# Patient Record
Sex: Male | Born: 1960 | Race: Black or African American | Hispanic: No | Smoking: Former smoker
Health system: Southern US, Community
[De-identification: ages and names within clinical notes are randomized; demographics above are authoritative.]

## PROBLEM LIST (undated history)

## (undated) DIAGNOSIS — E119 Type 2 diabetes mellitus without complications: Secondary | ICD-10-CM

## (undated) DIAGNOSIS — I4891 Unspecified atrial fibrillation: Secondary | ICD-10-CM

## (undated) DIAGNOSIS — K219 Gastro-esophageal reflux disease without esophagitis: Secondary | ICD-10-CM

## (undated) DIAGNOSIS — E785 Hyperlipidemia, unspecified: Secondary | ICD-10-CM

## (undated) DIAGNOSIS — I10 Essential (primary) hypertension: Secondary | ICD-10-CM

## (undated) DIAGNOSIS — I517 Cardiomegaly: Secondary | ICD-10-CM

## (undated) HISTORY — PX: NO PAST SURGERIES: SHX2092

---

## 2004-03-31 ENCOUNTER — Other Ambulatory Visit: Payer: Self-pay

## 2010-07-13 ENCOUNTER — Inpatient Hospital Stay: Payer: Self-pay | Admitting: Internal Medicine

## 2010-12-16 ENCOUNTER — Emergency Department: Payer: Self-pay | Admitting: Emergency Medicine

## 2011-06-07 ENCOUNTER — Observation Stay: Payer: Self-pay | Admitting: Internal Medicine

## 2011-06-07 DIAGNOSIS — R002 Palpitations: Secondary | ICD-10-CM

## 2011-06-07 DIAGNOSIS — R079 Chest pain, unspecified: Secondary | ICD-10-CM

## 2011-06-07 DIAGNOSIS — I059 Rheumatic mitral valve disease, unspecified: Secondary | ICD-10-CM

## 2012-04-19 ENCOUNTER — Observation Stay: Payer: Self-pay | Admitting: Family Medicine

## 2012-04-19 ENCOUNTER — Ambulatory Visit: Payer: Self-pay | Admitting: Internal Medicine

## 2012-04-19 LAB — CBC
HCT: 46.7 % (ref 40.0–52.0)
MCHC: 33.2 g/dL (ref 32.0–36.0)
Platelet: 174 10*3/uL (ref 150–440)
RDW: 12.7 % (ref 11.5–14.5)
WBC: 4.9 10*3/uL (ref 3.8–10.6)

## 2012-04-19 LAB — CK TOTAL AND CKMB (NOT AT ARMC)
CK, Total: 180 U/L (ref 35–232)
CK-MB: 3.9 ng/mL — ABNORMAL HIGH (ref 0.5–3.6)

## 2012-04-19 LAB — TROPONIN I
Troponin-I: <0.02 ng/mL
Troponin-I: <0.02 ng/mL

## 2012-04-19 LAB — BASIC METABOLIC PANEL
Calcium, Total: 9.2 mg/dL (ref 8.5–10.1)
Co2: 25 mmol/L (ref 21–32)
Creatinine: 1.27 mg/dL (ref 0.60–1.30)
EGFR (African American): >60
Potassium: 3.7 mmol/L (ref 3.5–5.1)
Sodium: 142 mmol/L (ref 136–145)

## 2012-04-19 LAB — HEPATIC FUNCTION PANEL A (ARMC)
Bilirubin,Total: 0.4 mg/dL (ref 0.2–1.0)
SGOT(AST): 26 U/L (ref 15–37)

## 2012-04-20 LAB — LIPID PANEL
Cholesterol: 148 mg/dL (ref 0–200)
HDL Cholesterol: 26 mg/dL — ABNORMAL LOW (ref 40–60)

## 2012-04-20 LAB — CK TOTAL AND CKMB (NOT AT ARMC)
CK, Total: 147 U/L (ref 35–232)
CK-MB: 3.4 ng/mL (ref 0.5–3.6)

## 2012-07-06 ENCOUNTER — Emergency Department: Payer: Self-pay | Admitting: Emergency Medicine

## 2012-07-06 LAB — CBC
MCH: 28.6 pg (ref 26.0–34.0)
Platelet: 171 10*3/uL (ref 150–440)
RDW: 12.9 % (ref 11.5–14.5)
WBC: 3.9 10*3/uL (ref 3.8–10.6)

## 2012-07-07 LAB — COMPREHENSIVE METABOLIC PANEL
Albumin: 4 g/dL (ref 3.4–5.0)
Alkaline Phosphatase: 137 U/L — ABNORMAL HIGH (ref 50–136)
Anion Gap: 9 (ref 7–16)
BUN: 18 mg/dL (ref 7–18)
Bilirubin,Total: 0.5 mg/dL (ref 0.2–1.0)
Calcium, Total: 8.9 mg/dL (ref 8.5–10.1)
Chloride: 104 mmol/L (ref 98–107)
Co2: 24 mmol/L (ref 21–32)
Creatinine: 1.29 mg/dL (ref 0.60–1.30)
EGFR (African American): >60
EGFR (Non-African Amer.): >60
Glucose: 345 mg/dL — ABNORMAL HIGH (ref 65–99)
Potassium: 4.2 mmol/L (ref 3.5–5.1)
SGOT(AST): 26 U/L (ref 15–37)
SGPT (ALT): 40 U/L (ref 12–78)
Total Protein: 7.5 g/dL (ref 6.4–8.2)

## 2012-07-07 LAB — URINALYSIS, COMPLETE
Bilirubin,UR: NEGATIVE
Glucose,UR: >=500 mg/dL (ref 0–75)
Ketone: NEGATIVE
Protein: NEGATIVE
RBC,UR: 1 /HPF (ref 0–5)
Specific Gravity: 1.025 (ref 1.003–1.030)
WBC UR: 4 /HPF (ref 0–5)

## 2012-11-29 ENCOUNTER — Emergency Department: Payer: Self-pay | Admitting: Emergency Medicine

## 2012-11-29 LAB — COMPREHENSIVE METABOLIC PANEL
Albumin: 3.6 g/dL (ref 3.4–5.0)
Alkaline Phosphatase: 113 U/L (ref 50–136)
Anion Gap: 7 (ref 7–16)
Calcium, Total: 8.6 mg/dL (ref 8.5–10.1)
Co2: 28 mmol/L (ref 21–32)
Creatinine: 1.16 mg/dL (ref 0.60–1.30)
Glucose: 179 mg/dL — ABNORMAL HIGH (ref 65–99)
Osmolality: 289 (ref 275–301)
Potassium: 3.6 mmol/L (ref 3.5–5.1)
SGOT(AST): 19 U/L (ref 15–37)
SGPT (ALT): 29 U/L (ref 12–78)
Total Protein: 7.1 g/dL (ref 6.4–8.2)

## 2012-11-29 LAB — CBC
HCT: 48.8 % (ref 40.0–52.0)
HGB: 16.2 g/dL (ref 13.0–18.0)
MCH: 28 pg (ref 26.0–34.0)
Platelet: 176 10*3/uL (ref 150–440)
RBC: 5.81 10*6/uL (ref 4.40–5.90)
RDW: 13.1 % (ref 11.5–14.5)
WBC: 4.5 10*3/uL (ref 3.8–10.6)

## 2012-11-29 LAB — TROPONIN I: Troponin-I: <0.02 ng/mL

## 2012-11-29 LAB — TSH: Thyroid Stimulating Horm: 1.09 u[IU]/mL

## 2015-01-01 NOTE — Consult Note (Signed)
PATIENT NAME:  Earl Griffin, Earl Griffin MR#:  [id number] DATE OF BIRTH:  12-20-1960  DATE OF CONSULTATION:  04/20/2012  REFERRING PHYSICIAN:  Larena GlassmanAmir Firozvi, MD CONSULTING PHYSICIAN:  Lamar BlinksBruce J. Maizee Reinhold, MD  PRIMARY CARE PHYSICIAN: Evelene CroonMeindert Niemeyer, MD  REASON FOR CONSULTATION: Atrial fibrillation with unstable angina.   CHIEF COMPLAINT: "I have chest pain."   HISTORY OF PRESENT ILLNESS: This is a 54 year old male known chronic atrial fibrillation with controlled rate on Cardizem and aspirin with a CHADS score of 0 who has had new of substernal chest discomfort and pressure radiating into his back associated with shortness of breath lasting for approximately 3 to 4 hours and somewhat relieved with nitroglycerin and oxygen. The patient has a troponin and CK-MB with no evidence of myocardial infarction. The patient's EKG shows atrial fibrillation with controlled ventricular rate and nonspecific ST changes. The patient has had no other symptoms since admission.   REVIEW OF SYSTEMS: The remainder review of systems is negative for vision change, ringing in the ears, hearing loss, cough, congestion, heartburn, nausea, vomiting, diarrhea, bloody stools, stomach pain, extremity pain, leg weakness, cramping of the buttocks, known blood clots, headaches, blackouts, dizzy spells, nosebleeds, congestion, trouble swallowing, frequent urination, urination at night, muscle weakness, numbness, anxiety, depression, skin lesions, or skin rashes.   PAST MEDICAL HISTORY: Atrial fibrillation.   FAMILY HISTORY: Father had early onset of myocardial infarction.   SOCIAL HISTORY: Currently denies alcohol or tobacco use.   ALLERGIES: No known drug allergies.   CURRENT MEDICATIONS: As listed.    PHYSICAL EXAMINATION:   VITAL SIGNS: Blood pressure 126/68 bilaterally and heart rate is 72 upright and reclining, and irregular.   GENERAL: He is a well appearing male in no acute distress.   HEENT: No icterus, thyromegaly,  ulcers, hemorrhage, or xanthelasma.   HEART: Irregularly irregular with normal S1 and S2 without murmur, gallop, or rub. Point of maximal impulse is diffuse. Carotid upstroke is normal without bruit. Jugular venous pressure is normal.   LUNGS: Lungs are clear to auscultation with normal respirations.   ABDOMEN: Soft and nontender without hepatosplenomegaly or masses. Abdominal aorta is normal size without bruit.   EXTREMITIES: 2+ radial, femoral, and dorsal pedal pulses with no lower extremity edema, cyanosis, clubbing, or ulcers.   NEUROLOGIC: He is oriented to time, place, and person with normal mood and affect.   ASSESSMENT: This is a 54 year old male with chronic atrial fibrillation and borderline hypertension with abnormal EKG, chest pain, and pressure needing further treatment options.   RECOMMENDATIONS:  1. Treadmill EKG to assess for myocardial ischemia and rhythm disturbances and control of rhythm with further treatment thereof as necessary. 2. Aspirin for anticoagulation for further risk reduction and stroke.          3. Continue Cardizem for heart rate control, but would consider further possible intervention including the possibility of antiarrhythmics with electrical cardioversion and other issues including ablation for which we have discussed at length at this time. He will potentially pursue those depending on stress test and/or echocardiographic results.  ____________________________ Lamar BlinksBruce J. Weldon Nouri, MD bjk:slb D: 04/20/2012 14:19:09 ET T: 04/20/2012 15:54:22 ET JOB#: [id number]  cc: Lamar BlinksBruce J. Jaydeen Odor, MD, <Dictator> Lamar BlinksBRUCE J Omari Koslosky MD ELECTRONICALLY SIGNED 04/20/2012 17:16

## 2015-01-01 NOTE — H&P (Signed)
PATIENT NAME:  Earl Griffin, Earl Griffin MR#:  [id number] DATE OF BIRTH:  Mar 14, 1961  DATE OF ADMISSION:  04/19/2012  REFERRING PHYSICIAN: Si Raider, MD  PRIMARY CARE PHYSICIAN: Evelene Croon, MD  CARDIOLOGIST: Adrian Blackwater, MD  REASON FOR ADMISSION: Chest pain.   HISTORY OF PRESENT ILLNESS: This is a pleasant 54 year old African American male with history of hypertension, atrial fibrillation that is rate controlled, and diet-controlled diabetes who at around 10:00 a.m. had some chest tightness lasting 10 to 20 minutes. He states it happened after drinking iced tea quickly. He said it felt like he was having to burp or had gas in his throat. He said he also got anxious and felt a little bit lightheaded. No shortness of breath, nausea, vomiting, or diarrhea. He went to [facility] and was given aspirin 325 mg and was referred here. In the Emergency Room, Dr. Brien Mates did an EKG which showed atrial fibrillation and labs which showed no troponin leak and chest x-ray was ordered. The patient currently is chest pain free.   PAST MEDICAL HISTORY:  1. Atrial fibrillation. 2. Obesity. 3. Hypertension. 4. Type 2 diabetes.   PAST SURGICAL HISTORY: None.   MEDICATIONS:  1. Aspirin 325 mg daily.  2. Diltiazem 120 mg daily.   DRUG ALLERGIES: No known drug allergies.   SOCIAL HISTORY: He is divorced with one child who is healthy. He works as a Merchandiser, retail in [employer]. He used to smoke 2 to 3 cigarettes a day, quit six years ago.   FAMILY HISTORY: Father died at age 86 of CVA. He had a heart attack at age 76. Mother died at age 26 of breast cancer.   REVIEW OF SYSTEMS: CONSTITUTIONAL: No fever, fatigue, or weakness. EYES: No blurred vision, double vision, pain, redness, inflammation, or glaucoma. ENT: No tinnitus, ear pain, hearing loss, seasonal allergies, epistaxis, or discharge. RESPIRATORY: No cough, wheezing, hemoptysis, dizziness, asthma, or painful respiration. CARDIOVASCULAR: He has chest  pain which is resolved. No orthopnea, edema, arrhythmias, or dyspnea on exertion. GI: No nausea, vomiting, diarrhea, abdominal pain, hematemesis, melena, or gastroesophageal reflux disease. GENITOURINARY: No dysuria, hematuria, renal calculi, or increased frequency. GU/MALE: No sores, discharge, prostatitis, or erectile dysfunction. ENDOCRINE: No polyuria, nocturia, thyroid problems, increased sweating, or heat or cold intolerance. HEME/LYMPH: No anemia, easy bruising, or swollen glands. INTEGUMENT: No acne, rash, or change in mole, hair, or skin. MUSCULOSKELETAL: No pain in back, shoulders, knees, or hips. No arthritis, swelling, or gout. NEUROLOGIC: No numbness, weakness, dysarthria, epilepsy, tremor, vertigo, or ataxia. PSYCH: No anxiety, insomnia, ADD, bipolar, or depression.   PHYSICAL EXAMINATION:   VITAL SIGNS: Temperature 96.6, heart rate 89, respirations 18, blood pressure 145/100, and saturating 97% on room air.   GENERAL: The patient well developed, well nourished, high BMI.   HEENT: Pupils are equal and reactive to light and accommodation. Extraocular movements are intact. Anicteric sclerae. No difficulty hearing. Oropharynx is clear.   NECK: No JVD. No thyromegaly. No lymphadenopathy. No carotid bruits.   LUNGS: Clear to auscultation. No adventitious breath sounds. Resonant to percussion.   CARDIOVASCULAR: Regular rate and rhythm. Normal S1 and S2. No murmurs, gallops, or rubs appreciated. No lower extremity edema. PMI is not lateralized. No thrills on palpation.   BREASTS: No obvious masses.   ABDOMEN: Soft, nontender, and nondistended. Positive bowel sounds. No organomegaly.   GU: Deferred.   MUSCULOSKELETAL: Strength 5/5. No clubbing, cyanosis, or degenerative joint disease.   SKIN: No rashes, lesions, or induration.   LYMPH: No lymphadenopathy  in the cervical or supraclavicular area.  NEUROLOGIC: Cranial nerves II through XII are intact. Sensation intact. Strength 5 out  of 5. No aphasia, dysphagia, or contractures.   PSYCH: Alert and oriented x3.   LABORATORY, DIAGNOSTIC AND RADIOLOGIC DATA: EKG shows atrial fibrillation, rate of 84.   Chest x-ray shows no acute cardiopulmonary process on my examination.  Glucose 213, BUN 16, creatinine 1.27, sodium 142, potassium 3.7, chloride 109, and bicarbonate 25.  CK 207, MB 4.3, and troponin less than 0.2. White count 4.9, hemoglobin 15.5, hematocrit 46.7, and platelets 174.   ASSESSMENT AND PLAN: This is a pleasant 54 year old African American male with history of hypertension, diabetes diet controlled, and atrial fibrillation who presents with chest pain. 1. Chest pain, most consistent with GERD. We will give Protonix. The patient has not had recent stress. We will go ahead and order this. I was trying to get this done today, but we are unable to do this. Dr. Welton FlakesKhan [place] so we will order this with Dr. Gwen PoundsKowalski. We have given p.r.n. morphine, nitroglycerin, beta blocker, and aspirin. In addition, we will give the patient Lovenox 1 mg/kg twice a day.  2. Hypertension. We have added beta blocker.  3. Rate-controlled atrial fibrillation. We will give diltiazem.  4. Obesity. Counseled about diet and exercise. Check a lipid panel. We will hold off on starting statin as it is not believed this is really unstable angina.  5. Diabetes. We will continue ADA diet and check A1c and start medications, i.e. metformin, if need be.  6. Deep vein thrombosis prophylaxis. Maintain with full dose Lovenox and aspirin.  CODE STATUS: FULL CODE.  Thank you for allowing me to participate in the care of this patient.   TOTAL TIME SPENT: 50 minutes. ____________________________ Corie ChiquitoAmir A. Lafayette DragonFirozvi, MD aaf:slb D: 04/19/2012 13:48:15 ET T: 04/19/2012 14:51:49 ET JOB#: [id number]  cc: Karolee OhsAmir A. [place] DragonFirozvi, MD, <Dictator> Meindert A. Lacie ScottsNiemeyer, MD Laurier NancyShaukat A. Khan, MD Karolee OhsAMIR Laverda PageA Daimian Sudberry MD ELECTRONICALLY SIGNED 04/19/2012 16:09

## 2015-01-01 NOTE — Discharge Summary (Signed)
PATIENT NAME:  Earl Griffin, Earl Griffin MR#:  [id number] DATE OF BIRTH:  05/25/1961  DATE OF ADMISSION:  04/19/2012 DATE OF DISCHARGE:  04/20/2012  REASON FOR ADMISSION: Chest pain.   OTHER DIAGNOSES:  1. Gastroesophageal reflux disease, which is the final diagnosis given for the chest pain.  2. Atrial fibrillation.  3. Obesity.  4. Hypertension.  5. Type 2 diabetes.  6. Low HDL.   PROCEDURES: Cardiac stress test completed and reviewed by Dr. Arnoldo HookerBruce Kowalski with normal perfusion and reasonable heart control for what the patient is good to be discharged.  DISCHARGE MEDICATIONS: 1. Aspirin 325 mg daily. 2. Diltiazem 120 mg once a day. 3. Protonix 40 mg once a day.  4. Niacin 250 mg extended release.  NOTE: Protonix and niacin are new prescriptions given to the patient.   HOSPITAL COURSE: Mr. Earl Knackerry Drohan is a very nice 54 year old man male with history of atrial fibrillation, diabetes, hypertension, and obesity. He came to the ER with a history of pain around 10:00 a.m. in the morning, chest tightness lasting 10 to 20 minutes. The patient was drinking some iced tea very quickly and he felt like he was burping and having some gas stuck there. He had some burning sensation after a while that lasted for a little bit longer. [facility] and he was given 325 mg of aspirin. EKG showed atrial fibrillation and his labs showed a troponin leak for what the patient was sent to the ER at [facility].   He was brought up and put on morphine, nitroglycerin, and a beta blocker in conjunction with the aspirin, and a stress test was ordered for this morning. His atrial fibrillation was controlled all along and his stress test was negative, as mentioned in the test results above.   In discussion with the patient about control of his GERD, Protonix prescription has been given to the patient as well as a niacin prescription. The niacin prescription is given due to the fact that the  patient had a very low HDL of 26, his LDL is actually normal at 24, and his triglycerides are only 140.   The patient is warned about side effects of niacin so he does not get scared if he has any flushing or increased temperature on the level of his face. The patient is discharged in good condition. He needs to followup with his primary care physician, Dr. Evelene CroonMeindert Niemeyer, to figure out continuation of treatment of his low HDL. ____________________________ Felipa Furnaceoberto Sanchez Gutierrez, MD rsg:slb D: 04/20/2012 14:25:11 ET T: 04/21/2012 10:05:51 ET JOB#: [id number]  cc: Felipa Furnaceoberto Sanchez Gutierrez, MD, <Dictator> Meindert A. Lacie ScottsNiemeyer, MD Regan RakersOBERTO Juanda ChanceSANCHEZ GUTIERRE MD ELECTRONICALLY SIGNED 04/30/2012 13:19

## 2015-10-03 ENCOUNTER — Encounter: Payer: Self-pay | Admitting: *Deleted

## 2015-10-04 ENCOUNTER — Encounter: Payer: Self-pay | Admitting: *Deleted

## 2015-10-04 ENCOUNTER — Encounter: Admission: RE | Payer: Self-pay | Source: Ambulatory Visit

## 2015-10-04 ENCOUNTER — Ambulatory Visit
Admission: RE | Admit: 2015-10-04 | Payer: BLUE CROSS/BLUE SHIELD | Source: Ambulatory Visit | Admitting: Gastroenterology

## 2015-10-04 HISTORY — DX: Type 2 diabetes mellitus without complications: E11.9

## 2015-10-04 HISTORY — DX: Gastro-esophageal reflux disease without esophagitis: K21.9

## 2015-10-04 HISTORY — DX: Essential (primary) hypertension: I10

## 2015-10-04 HISTORY — DX: Cardiomegaly: I51.7

## 2015-10-04 HISTORY — DX: Unspecified atrial fibrillation: I48.91

## 2015-10-04 HISTORY — DX: Hyperlipidemia, unspecified: E78.5

## 2015-10-04 SURGERY — COLONOSCOPY WITH PROPOFOL
Anesthesia: General

## 2015-10-04 NOTE — Anesthesia Preprocedure Evaluation (Deleted)

## 2015-12-02 ENCOUNTER — Emergency Department
Admission: EM | Admit: 2015-12-02 | Discharge: 2015-12-02 | Disposition: A | Discharge status: Home / Self Care | Payer: 59 | Attending: Emergency Medicine | Admitting: Emergency Medicine

## 2015-12-02 ENCOUNTER — Encounter: Payer: Self-pay | Admitting: Emergency Medicine

## 2015-12-02 DIAGNOSIS — Z7982 Long term (current) use of aspirin: Secondary | ICD-10-CM | POA: Diagnosis not present

## 2015-12-02 DIAGNOSIS — R42 Dizziness and giddiness: Secondary | ICD-10-CM | POA: Diagnosis present

## 2015-12-02 DIAGNOSIS — E119 Type 2 diabetes mellitus without complications: Secondary | ICD-10-CM | POA: Insufficient documentation

## 2015-12-02 DIAGNOSIS — I480 Paroxysmal atrial fibrillation: Secondary | ICD-10-CM | POA: Diagnosis not present

## 2015-12-02 DIAGNOSIS — I10 Essential (primary) hypertension: Secondary | ICD-10-CM | POA: Diagnosis not present

## 2015-12-02 DIAGNOSIS — Z79899 Other long term (current) drug therapy: Secondary | ICD-10-CM | POA: Insufficient documentation

## 2015-12-02 LAB — BASIC METABOLIC PANEL
ANION GAP: 4 — AB (ref 5–15)
BUN: 16 mg/dL (ref 6–20)
CHLORIDE: 107 mmol/L (ref 101–111)
CO2: 25 mmol/L (ref 22–32)
Calcium: 9 mg/dL (ref 8.9–10.3)
Creatinine, Ser: 1.47 mg/dL — ABNORMAL HIGH (ref 0.61–1.24)
GFR calc Af Amer: >60 mL/min (ref >60)
GFR, EST NON AFRICAN AMERICAN: 52 mL/min — AB (ref >60)
GLUCOSE: 136 mg/dL — AB (ref 65–99)
POTASSIUM: 3.7 mmol/L (ref 3.5–5.1)
Sodium: 136 mmol/L (ref 135–145)

## 2015-12-02 LAB — TROPONIN I: Troponin I: <0.03 ng/mL (ref <0.031)

## 2015-12-02 LAB — CBC
HEMATOCRIT: 49.8 % (ref 40.0–52.0)
HEMOGLOBIN: 16.7 g/dL (ref 13.0–18.0)
MCH: 28.3 pg (ref 26.0–34.0)
MCHC: 33.5 g/dL (ref 32.0–36.0)
MCV: 84.3 fL (ref 80.0–100.0)
Platelets: 197 10*3/uL (ref 150–440)
RBC: 5.91 MIL/uL — ABNORMAL HIGH (ref 4.40–5.90)
RDW: 13.1 % (ref 11.5–14.5)
WBC: 5.5 10*3/uL (ref 3.8–10.6)

## 2015-12-02 MED ORDER — AMIODARONE HCL 400 MG PO TABS: 400.0000 mg | ORAL_TABLET | Freq: Two times a day (BID) | ORAL | Status: DC

## 2015-12-02 MED ORDER — APIXABAN 5 MG PO TABS
5.0000 mg | ORAL_TABLET | Freq: Two times a day (BID) | ORAL | Status: AC
Start: 2015-12-02 — End: ?

## 2015-12-02 MED ORDER — APIXABAN 5 MG PO TABS
5.0000 mg | ORAL_TABLET | Freq: Once | ORAL | Status: AC
  Administered 2015-12-02: 5 mg via ORAL
  Filled 2015-12-02: qty 1

## 2015-12-02 NOTE — ED Notes (Signed)
Pharmacy called and notified of need of eliquis. States they will send it up.

## 2015-12-02 NOTE — ED Notes (Signed)
Pt had a "dizzy spell" at work, drove home and got dizzy in the car so he called EMS.  EMS reports pt in a fib, vitals in normal range, skin w/d. 20g in left wrist.

## 2015-12-02 NOTE — ED Provider Notes (Signed)
[facility] Emergency Department Provider Note  Time seen: 4:55 PM  I have reviewed the triage vital signs and the nursing notes.   HISTORY  Chief Complaint Dizziness    HPI Earl Griffin is a 55 y.o. male with a past medical history of hypertension, hyperlipidemia, paroxysmal atrial fibrillation who presents the emergency department with heart palpitations and a dizziness sensation. According to the patient at 12:30 PM he began feeling heart palpitations any dizziness sensation which she states feels like it doesn't his heart goes into atrial fibrillation. Patient states he has not been in atrial fibrillation for greater than 3 years. No longer follows up with his cardiologist Dr.Khan.  Patient denies any chest pain or trouble breathing.Describes the palpitations as mild, dizziness has resolved.     Past Medical History  Diagnosis Date  . Hypertension   . Mild left ventricular hypertrophy   . Hyperlipidemia   . Diabetes mellitus without complication (HCC)   . GERD (gastroesophageal reflux disease)   . Unspecified atrial fibrillation (HCC)     There are no active problems to display for this patient.   Past Surgical History  Procedure Laterality Date  . No past surgeries      Current Outpatient Rx  Name  Route  Sig  Dispense  Refill  . aspirin 325 MG tablet   Oral   Take 325 mg by mouth daily.         Marland Kitchen diltiazem (CARDIZEM) 120 MG tablet   Oral   Take 120 mg by mouth 4 (four) times daily.         . Empagliflozin-Linagliptin (GLYXAMBI) 10-5 MG TABS   Oral   Take by mouth.         . etodolac (LODINE) 400 MG tablet   Oral   Take 400 mg by mouth 2 (two) times daily.         Marland Kitchen gabapentin (NEURONTIN) 300 MG capsule   Oral   Take 300 mg by mouth 3 (three) times daily.         Marland Kitchen lisinopril (PRINIVIL,ZESTRIL) 10 MG tablet   Oral   Take 10 mg by mouth daily.         Marland Kitchen loratadine (CLARITIN) 10 MG tablet   Oral   Take 10 mg by  mouth daily.         . pravastatin (PRAVACHOL) 40 MG tablet   Oral   Take 40 mg by mouth daily.         Marland Kitchen tiZANidine (ZANAFLEX) 4 MG capsule   Oral   Take 4 mg by mouth 3 (three) times daily.           Allergies Review of patient's allergies indicates no known allergies.  History reviewed. No pertinent family history.  Social History Social History  Substance Use Topics  . Smoking status: Never Smoker   . Smokeless tobacco: None  . Alcohol Use: None    Review of Systems Constitutional: Negative for fever. Cardiovascular: Negative for chest pain. Positive for palpitations. Respiratory: Negative for shortness of breath. Gastrointestinal: Negative for abdominal pain Musculoskeletal: Negative for back pain. Neurological: Negative for headache 10-point ROS otherwise negative.  ____________________________________________   PHYSICAL EXAM:  VITAL SIGNS: ED Triage Vitals  Enc Vitals Group     BP 12/02/15 1316 144/82 mmHg     Pulse Rate 12/02/15 1316 78     Resp 12/02/15 1316 20     Temp 12/02/15 1316 97.9 F (36.6 C)  Temp Source 12/02/15 1316 Oral     SpO2 12/02/15 1316 96 %     Weight 12/02/15 1316 255 lb (115.667 kg)     Height 12/02/15 1316 6\' 4"  (1.93 m)     Head Cir --      Peak Flow --      Pain Score 12/02/15 1300 1     Pain Loc --      Pain Edu? --      Excl. in GC? --     Constitutional: Alert and oriented. Well appearing and in no distress. Eyes: Normal exam ENT   Head: Normocephalic and atraumatic.   Mouth/Throat: Mucous membranes are moist. Cardiovascular: Irregular rhythm, rate around 90 bpm. No murmur. Respiratory: Normal respiratory effort without tachypnea nor retractions. Breath sounds are clear  Gastrointestinal: Soft and nontender. No distention.  Musculoskeletal: Nontender with normal range of motion in all extremities.  Neurologic:  Normal speech and language. No gross focal neurologic deficits  Skin:  Skin is warm, dry  and intact.  Psychiatric: Mood and affect are normal. Speech and behavior are normal.  ____________________________________________    EKG  EKG reviewed and interpreted by my self shows atrial fibrillation at 83 bpm, narrow QRS, normal axis, otherwise normal intervals, nonspecific but no concerning ST changes.  ____________________________________________   INITIAL IMPRESSION / ASSESSMENT AND PLAN / ED COURSE  Pertinent labs & imaging results that were available during my care of the patient were reviewed by me and considered in my medical decision making (see chart for details).  Patient presents with palpitations, EKG consistent with atrial fibrillation. I discussed with Dr.Khan, who wishes for the patient to be started on Eliquis as well as 400 mg of amiodarone and he will see the patient and 9 AM tomorrow. I discussed this with the patient who is agreeable to plan.  Labs showed mild renal insufficiency, otherwise within normal limits. Troponin negative.  ____________________________________________   FINAL CLINICAL IMPRESSION(S) / ED DIAGNOSES  Atrial fibrillation   Minna AntisKevin Ayriel Texidor, MD 12/02/15 1702

## 2015-12-02 NOTE — Discharge Instructions (Signed)
Please follow-up with Dr.Khan at 9 AM tomorrow morning in his office. Return to the emergency department for any chest pain or trouble breathing.  Atrial Fibrillation Atrial fibrillation is a type of irregular or rapid heartbeat (arrhythmia). In atrial fibrillation, the heart quivers continuously in a chaotic pattern. This occurs when parts of the heart receive disorganized signals that make the heart unable to pump blood normally. This can increase the risk for stroke, heart failure, and other heart-related conditions. There are different types of atrial fibrillation, including:  Paroxysmal atrial fibrillation. This type starts suddenly, and it usually stops on its own shortly after it starts.  Persistent atrial fibrillation. This type often lasts longer than a week. It may stop on its own or with treatment.  Long-lasting persistent atrial fibrillation. This type lasts longer than 12 months.  Permanent atrial fibrillation. This type does not go away. Talk with your health care provider to learn about the type of atrial fibrillation that you have. CAUSES This condition is caused by some heart-related conditions or procedures, including:  A heart attack.  Coronary artery disease.  Heart failure.  Heart valve conditions.  High blood pressure.  Inflammation of the sac that surrounds the heart (pericarditis).  Heart surgery.  Certain heart rhythm disorders, such as Wolf-Parkinson-White syndrome. Other causes include:  Pneumonia.  Obstructive sleep apnea.  Blockage of an artery in the lungs (pulmonary embolism, or PE).  Lung cancer.  Chronic lung disease.  Thyroid problems, especially if the thyroid is overactive (hyperthyroidism).  Caffeine.  Excessive alcohol use or illegal drug use.  Use of some medicines, including certain decongestants and diet pills. Sometimes, the cause cannot be found. RISK FACTORS This condition is more likely to develop in:  People who are  older in age.  People who smoke.  People who have diabetes mellitus.  People who are overweight (obese).  Athletes who exercise vigorously. SYMPTOMS Symptoms of this condition include:  A feeling that your heart is beating rapidly or irregularly.  A feeling of discomfort or pain in your chest.  Shortness of breath.  Sudden light-headedness or weakness.  Getting tired easily during exercise. In some cases, there are no symptoms. DIAGNOSIS Your health care provider may be able to detect atrial fibrillation when taking your pulse. If detected, this condition may be diagnosed with:  An electrocardiogram (ECG).  A Holter monitor test that records your heartbeat patterns over a 24-hour period.  Transthoracic echocardiogram (TTE) to evaluate how blood flows through your heart.  Transesophageal echocardiogram (TEE) to view more detailed images of your heart.  A stress test.  Imaging tests, such as a CT scan or chest X-ray.  Blood tests. TREATMENT The main goals of treatment are to prevent blood clots from forming and to keep your heart beating at a normal rate and rhythm. The type of treatment that you receive depends on many factors, such as your underlying medical conditions and how you feel when you are experiencing atrial fibrillation. This condition may be treated with:  Medicine to slow down the heart rate, bring the heart's rhythm back to normal, or prevent clots from forming.  Electrical cardioversion. This is a procedure that resets your heart's rhythm by delivering a controlled, low-energy shock to the heart through your skin.  Different types of ablation, such as catheter ablation, catheter ablation with pacemaker, or surgical ablation. These procedures destroy the heart tissues that send abnormal signals. When the pacemaker is used, it is placed under your skin to help your  heart beat in a regular rhythm. HOME CARE INSTRUCTIONS  Take over-the counter and  prescription medicines only as told by your health care provider.  If your health care provider prescribed a blood-thinning medicine (anticoagulant), take it exactly as told. Taking too much blood-thinning medicine can cause bleeding. If you do not take enough blood-thinning medicine, you will not have the protection that you need against stroke and other problems.  Do not use tobacco products, including cigarettes, chewing tobacco, and e-cigarettes. If you need help quitting, ask your health care provider.  If you have obstructive sleep apnea, manage your condition as told by your health care provider.  Do not drink alcohol.  Do not drink beverages that contain caffeine, such as coffee, soda, and tea.  Maintain a healthy weight. Do not use diet pills unless your health care provider approves. Diet pills may make heart problems worse.  Follow diet instructions as told by your health care provider.  Exercise regularly as told by your health care provider.  Keep all follow-up visits as told by your health care provider. This is important. PREVENTION  Avoid drinking beverages that contain caffeine or alcohol.  Avoid certain medicines, especially medicines that are used for breathing problems.  Avoid certain herbs and herbal medicines, such as those that contain ephedra or ginseng.  Do not use illegal drugs, such as cocaine and amphetamines.  Do not smoke.  Manage your high blood pressure. SEEK MEDICAL CARE IF:  You notice a change in the rate, rhythm, or strength of your heartbeat.  You are taking an anticoagulant and you notice increased bruising.  You tire more easily when you exercise or exert yourself. SEEK IMMEDIATE MEDICAL CARE IF:  You have chest pain, abdominal pain, sweating, or weakness.  You feel nauseous.  You notice blood in your vomit, bowel movement, or urine.  You have shortness of breath.  You suddenly have swollen feet and ankles.  You feel  dizzy.  You have sudden weakness or numbness of the face, arm, or leg, especially on one side of the body.  You have trouble speaking, trouble understanding, or both (aphasia).  Your face or your eyelid droops on one side. These symptoms may represent a serious problem that is an emergency. Do not wait to see if the symptoms will go away. Get medical help right away. Call your local emergency services (911 in the [place] Do not drive yourself to the hospital.   This information is not intended to replace advice given to you by your health care provider. Make sure you discuss any questions you have with your health care provider.   Document Released: 08/31/2005 Document Revised: 05/22/2015 Document Reviewed: 12/26/2014 Elsevier Interactive Patient Education [employer]2016 [employer] Inc.

## 2016-01-19 ENCOUNTER — Emergency Department
Admission: EM | Admit: 2016-01-19 | Discharge: 2016-01-19 | Disposition: A | Discharge status: Home / Self Care | Payer: 59 | Attending: Student | Admitting: Student

## 2016-01-19 ENCOUNTER — Emergency Department: Payer: 59

## 2016-01-19 DIAGNOSIS — R002 Palpitations: Secondary | ICD-10-CM | POA: Diagnosis present

## 2016-01-19 DIAGNOSIS — Z79899 Other long term (current) drug therapy: Secondary | ICD-10-CM | POA: Insufficient documentation

## 2016-01-19 DIAGNOSIS — Z7982 Long term (current) use of aspirin: Secondary | ICD-10-CM | POA: Diagnosis not present

## 2016-01-19 DIAGNOSIS — Z87891 Personal history of nicotine dependence: Secondary | ICD-10-CM | POA: Diagnosis not present

## 2016-01-19 DIAGNOSIS — R0602 Shortness of breath: Secondary | ICD-10-CM | POA: Insufficient documentation

## 2016-01-19 DIAGNOSIS — E119 Type 2 diabetes mellitus without complications: Secondary | ICD-10-CM | POA: Insufficient documentation

## 2016-01-19 DIAGNOSIS — I10 Essential (primary) hypertension: Secondary | ICD-10-CM | POA: Insufficient documentation

## 2016-01-19 LAB — CBC WITH DIFFERENTIAL/PLATELET
Basophils Absolute: 0 10*3/uL (ref 0–0.1)
Basophils Relative: 1 %
EOS ABS: 0.1 10*3/uL (ref 0–0.7)
EOS PCT: 2 %
HCT: 47.3 % (ref 40.0–52.0)
Hemoglobin: 16.1 g/dL (ref 13.0–18.0)
LYMPHS ABS: 0.9 10*3/uL — AB (ref 1.0–3.6)
Lymphocytes Relative: 19 %
MCH: 28.2 pg (ref 26.0–34.0)
MCHC: 34 g/dL (ref 32.0–36.0)
MCV: 83.1 fL (ref 80.0–100.0)
MONO ABS: 0.4 10*3/uL (ref 0.2–1.0)
Monocytes Relative: 8 %
Neutro Abs: 3.6 10*3/uL (ref 1.4–6.5)
Neutrophils Relative %: 72 %
PLATELETS: 189 10*3/uL (ref 150–440)
RBC: 5.69 MIL/uL (ref 4.40–5.90)
RDW: 12.9 % (ref 11.5–14.5)
WBC: 5.1 10*3/uL (ref 3.8–10.6)

## 2016-01-19 LAB — COMPREHENSIVE METABOLIC PANEL
ALT: 17 U/L (ref 17–63)
ANION GAP: 8 (ref 5–15)
AST: 19 U/L (ref 15–41)
Albumin: 3.9 g/dL (ref 3.5–5.0)
Alkaline Phosphatase: 84 U/L (ref 38–126)
BUN: 14 mg/dL (ref 6–20)
CHLORIDE: 107 mmol/L (ref 101–111)
CO2: 24 mmol/L (ref 22–32)
Calcium: 8.9 mg/dL (ref 8.9–10.3)
Creatinine, Ser: 1.15 mg/dL (ref 0.61–1.24)
Glucose, Bld: 185 mg/dL — ABNORMAL HIGH (ref 65–99)
POTASSIUM: 3.8 mmol/L (ref 3.5–5.1)
SODIUM: 139 mmol/L (ref 135–145)
Total Bilirubin: 0.5 mg/dL (ref 0.3–1.2)
Total Protein: 7.1 g/dL (ref 6.5–8.1)

## 2016-01-19 LAB — TROPONIN I

## 2016-01-19 NOTE — ED Notes (Signed)
Patient transported to X-ray 

## 2016-01-19 NOTE — ED Notes (Signed)
Pt reports hx of a. Fib.  States that while at work this morning he started feeling palpitations, chest heaviness, mild SOB.   He denies dizzines, SOB and chest pain at this  but he cont to have palpitations.

## 2016-01-19 NOTE — ED Provider Notes (Signed)
[facility] Emergency Department Provider Note   ____________________________________________  Time seen: Approximately 7:28 AM  I have reviewed the triage vital signs and the nursing notes.   HISTORY  Chief Complaint Palpitations    HPI Earl Griffin is a 55 y.o. male history of recent controlled atrial fibrillation on Cardizem, Eliquis, hypertension, hyponatremia, diabetes who presents for evaluation of resolved palpitations and shortness of breath that began this morning at approximately 6 AM while he was at rest, now resolved, initially moderate, no modifying factors pain patient reports that apparently 6 AM he began feeling as if his heart were racing in his chest and he was mildly short of breath. He adamantly denies any chest pain or heaviness though this was document in the triage note. He reports it felt like "my A. fib was acting up". He reports this has happened to him before and usually resolves on its own. His symptoms lasted until about 7:30 AM at which point they resolved spontaneously. Currently he reports he feels well and has no complaints. He reports "I know my heart is fine now, It's not beating like it was." He denies any recent illness including no cough, vomiting, diarrhea, fevers or chills. He denies any history of coronary artery disease. No history of PE or DVT.   Past Medical History  Diagnosis Date  . Hypertension   . Mild left ventricular hypertrophy   . Hyperlipidemia   . Diabetes mellitus without complication (HCC)   . GERD (gastroesophageal reflux disease)   . Unspecified atrial fibrillation (HCC)     There are no active problems to display for this patient.   Past Surgical History  Procedure Laterality Date  . No past surgeries      Current Outpatient Rx  Name  Route  Sig  Dispense  Refill  . amiodarone (PACERONE) 400 MG tablet   Oral   Take 1 tablet (400 mg total) by mouth 2 (two) times daily.   60 tablet   0     . apixaban (ELIQUIS) 5 MG TABS tablet   Oral   Take 1 tablet (5 mg total) by mouth 2 (two) times daily.   60 tablet   0   . aspirin 325 MG tablet   Oral   Take 325 mg by mouth daily.         Marland Kitchen diltiazem (CARDIZEM) 120 MG tablet   Oral   Take 120 mg by mouth 4 (four) times daily.         . Empagliflozin-Linagliptin (GLYXAMBI) 10-5 MG TABS   Oral   Take by mouth.         . etodolac (LODINE) 400 MG tablet   Oral   Take 400 mg by mouth 2 (two) times daily.         Marland Kitchen gabapentin (NEURONTIN) 300 MG capsule   Oral   Take 300 mg by mouth 3 (three) times daily.         Marland Kitchen lisinopril (PRINIVIL,ZESTRIL) 10 MG tablet   Oral   Take 10 mg by mouth daily.         Marland Kitchen loratadine (CLARITIN) 10 MG tablet   Oral   Take 10 mg by mouth daily.         . pravastatin (PRAVACHOL) 40 MG tablet   Oral   Take 40 mg by mouth daily.         Marland Kitchen tiZANidine (ZANAFLEX) 4 MG capsule   Oral   Take 4 mg  by mouth 3 (three) times daily.           Allergies Review of patient's allergies indicates no known allergies.  No family history on file.  Social History Social History  Substance Use Topics  . Smoking status: Former Games developer  . Smokeless tobacco: Not on file  . Alcohol Use: No    Review of Systems Constitutional: No fever/chills Eyes: No visual changes. ENT: No sore throat. Cardiovascular: Denies chest pain. Respiratory: +shortness of breath. Gastrointestinal: No abdominal pain.  No nausea, no vomiting.  No diarrhea.  No constipation. Genitourinary: Negative for dysuria. Musculoskeletal: Negative for back pain. Skin: Negative for rash. Neurological: Negative for headaches, focal weakness or numbness.  10-point ROS otherwise negative.  ____________________________________________   PHYSICAL EXAM:  Filed Vitals:   01/19/16 1000 01/19/16 1030 01/19/16 1100 01/19/16 1122  BP: 119/88 123/92 117/90   Pulse: 60 52    Temp:    97.7 F (36.5 C)  TempSrc:    Oral   Resp: 14 15 13    Height:      Weight:      SpO2: 94% 95%      VITAL SIGNS: ED Triage Vitals  Enc Vitals Group     BP 01/19/16 0722 131/98 mmHg     Pulse Rate 01/19/16 0722 80     Resp --      Temp --      Temp src --      SpO2 01/19/16 0722 96 %     Weight 01/19/16 0722 255 lb (115.667 kg)     Height 01/19/16 0722 6\' 4"  (1.93 m)     Head Cir --      Peak Flow --      Pain Score 01/19/16 0722 0     Pain Loc --      Pain Edu? --      Excl. in GC? --     Constitutional: Alert and oriented. Well appearing and in no acute distress. Eyes: Conjunctivae are normal. PERRL. EOMI. Head: Atraumatic. Nose: No congestion/rhinnorhea. Mouth/Throat: Mucous membranes are moist.  Oropharynx non-erythematous. Neck: No stridor. Supple without meningismus. Cardiovascular: Normal rate, irregular rhythm. Grossly normal heart sounds.  Good peripheral circulation. Respiratory: Normal respiratory effort.  No retractions. Lungs CTAB. Gastrointestinal: Soft and nontender. No distention. No CVA tenderness. Genitourinary: deferrred Musculoskeletal: No lower extremity tenderness nor edema.  No joint effusions. Neurologic:  Normal speech and language. No gross focal neurologic deficits are appreciated. No gait instability. Skin:  Skin is warm, dry and intact. No rash noted. Psychiatric: Mood and affect are normal. Speech and behavior are normal.  ____________________________________________   LABS (all labs ordered are listed, but only abnormal results are displayed)  Labs Reviewed  CBC WITH DIFFERENTIAL/PLATELET - Abnormal; Notable for the following:    Lymphs Abs 0.9 (*)    All other components within normal limits  COMPREHENSIVE METABOLIC PANEL - Abnormal; Notable for the following:    Glucose, Bld 185 (*)    All other components within normal limits  TROPONIN I  TROPONIN I   ____________________________________________  EKG  ED ECG REPORT I, Gayla Doss, the attending physician,  personally viewed and interpreted this ECG.   Date: 01/19/2016  EKG Time: 07:18  Rate: 89  Rhythm: atrial fibrillation, rate 89  Axis: none  Intervals:none  ST&T Change: No acute ST elevation.  ____________________________________________  RADIOLOGY  CXR IMPRESSION: No edema or consolidation.  ____________________________________________   PROCEDURES  Procedure(s) performed: None  Critical  Care performed: No  ____________________________________________   INITIAL IMPRESSION / ASSESSMENT AND PLAN / ED COURSE  Pertinent labs & imaging results that were available during my care of the patient were reviewed by me and considered in my medical decision making (see chart for details).  Earl Griffin is a 55 y.o. male history of recent controlled atrial fibrillation on Cardizem, Eliquis, hypertension, hyponatremia, diabetes who presents for evaluation of resolved palpitations and shortness of breath that began this morning at approximately 6 AM while he was at rest. On exam, he is very well-appearing in no acute distress. His vital signs are stable, he is afebrile. He is currently in rate controlled A. fib and is asymptomatic in terms of palpitations or shortness of breath. I suppose it is possible he could've been age of fibrillation with a rapid ventricular rate which terminated spontaneously. We will continue to monitor him on the cardiac monitor, obtain screening labs, troponin, reassess for disposition.  ----------------------------------------- 11:14 AM on 01/19/2016 -----------------------------------------  Patient continues to appear well, he remained asymptomatic. He is sitting up in bed watching TV without any shortness of breath or palpitations. 2 sets of Cardec enzymes are negative. He has not had any A. fib with RVR or life-threatening arrhythmia while observed on the cardiac monitor for greater than 3 hours in the emergency department. Discussed return precautions,  need for close PCP and cardiology follow-up and he is comfortable with the discharge plan. DC home. ____________________________________________   FINAL CLINICAL IMPRESSION(S) / ED DIAGNOSES  Final diagnoses:  Heart palpitations  SOB (shortness of breath)      NEW MEDICATIONS STARTED DURING THIS VISIT:  New Prescriptions   No medications on file     Note:  This document was prepared using Dragon voice recognition software and may include unintentional dictation errors.    Gayla DossEryka A Natarsha Hurwitz, MD 01/19/16 [phone]

## 2016-01-20 ENCOUNTER — Other Ambulatory Visit: Payer: Self-pay | Admitting: Orthopedic Surgery

## 2016-01-20 DIAGNOSIS — M25562 Pain in left knee: Secondary | ICD-10-CM

## 2018-04-15 ENCOUNTER — Other Ambulatory Visit: Payer: Self-pay

## 2018-04-15 ENCOUNTER — Emergency Department
Admission: EM | Admit: 2018-04-15 | Discharge: 2018-04-16 | Disposition: A | Discharge status: Home / Self Care | Payer: BLUE CROSS/BLUE SHIELD | Attending: Emergency Medicine | Admitting: Emergency Medicine

## 2018-04-15 DIAGNOSIS — I4891 Unspecified atrial fibrillation: Secondary | ICD-10-CM

## 2018-04-15 DIAGNOSIS — Z7984 Long term (current) use of oral hypoglycemic drugs: Secondary | ICD-10-CM | POA: Diagnosis not present

## 2018-04-15 DIAGNOSIS — Z7901 Long term (current) use of anticoagulants: Secondary | ICD-10-CM | POA: Insufficient documentation

## 2018-04-15 DIAGNOSIS — E119 Type 2 diabetes mellitus without complications: Secondary | ICD-10-CM | POA: Diagnosis not present

## 2018-04-15 DIAGNOSIS — Z87891 Personal history of nicotine dependence: Secondary | ICD-10-CM | POA: Diagnosis not present

## 2018-04-15 DIAGNOSIS — R0602 Shortness of breath: Secondary | ICD-10-CM | POA: Insufficient documentation

## 2018-04-15 DIAGNOSIS — R42 Dizziness and giddiness: Secondary | ICD-10-CM | POA: Diagnosis present

## 2018-04-15 DIAGNOSIS — I10 Essential (primary) hypertension: Secondary | ICD-10-CM | POA: Insufficient documentation

## 2018-04-15 DIAGNOSIS — I480 Paroxysmal atrial fibrillation: Secondary | ICD-10-CM | POA: Insufficient documentation

## 2018-04-15 LAB — COMPREHENSIVE METABOLIC PANEL
ALT: 24 U/L (ref 0–44)
AST: 22 U/L (ref 15–41)
Albumin: 4 g/dL (ref 3.5–5.0)
Alkaline Phosphatase: 82 U/L (ref 38–126)
Anion gap: 6 (ref 5–15)
BUN: 16 mg/dL (ref 6–20)
CHLORIDE: 108 mmol/L (ref 98–111)
CO2: 29 mmol/L (ref 22–32)
CREATININE: 1.07 mg/dL (ref 0.61–1.24)
Calcium: 8.9 mg/dL (ref 8.9–10.3)
Glucose, Bld: 211 mg/dL — ABNORMAL HIGH (ref 70–99)
Potassium: 3.8 mmol/L (ref 3.5–5.1)
Sodium: 143 mmol/L (ref 135–145)
Total Bilirubin: 0.7 mg/dL (ref 0.3–1.2)
Total Protein: 7.1 g/dL (ref 6.5–8.1)

## 2018-04-15 LAB — CBC
HCT: 44.5 % (ref 40.0–52.0)
Hemoglobin: 15.1 g/dL (ref 13.0–18.0)
MCH: 29 pg (ref 26.0–34.0)
MCHC: 33.9 g/dL (ref 32.0–36.0)
MCV: 85.6 fL (ref 80.0–100.0)
PLATELETS: 214 10*3/uL (ref 150–440)
RBC: 5.19 MIL/uL (ref 4.40–5.90)
RDW: 13.5 % (ref 11.5–14.5)
WBC: 5.1 10*3/uL (ref 3.8–10.6)

## 2018-04-15 LAB — TROPONIN I

## 2018-04-15 MED ORDER — DILTIAZEM HCL 60 MG PO TABS
120.0000 mg | ORAL_TABLET | Freq: Once | ORAL | Status: AC
  Administered 2018-04-15: 120 mg via ORAL
  Filled 2018-04-15: qty 2

## 2018-04-15 NOTE — ED Triage Notes (Signed)
Pt arrived via [facility] from home with c/o afib/dizziness. EMS states that pt did not take his meds this morning and then when he came home he was feelin\g SHOB.

## 2018-04-15 NOTE — ED Provider Notes (Signed)
[facility] Emergency Department Provider Note  Time Seen: 11:02 PM  I have reviewed the triage vital signs and the nursing notes.   HISTORY  Chief Complaint Atrial Fibrillation and Dizziness    HPI Earl Griffin is a 57 y.o. male with below list of chronic medical conditions including atrial fibrillation presents to the emergency department via EMS secondary to atrial fibrillation with concomitant dizziness and shortness of breath.  On EMS arrival lead EKG which was presented to the emergency department revealed atrial fibrillation with rapid ventricular response with a heart rate of 144.  Patient was given 5 mg of IV metoprolol in route.  Patient denies any symptoms at present.  Patient admits to not taking his diltiazem today.   Past Medical History:  Diagnosis Date  . Diabetes mellitus without complication (HCC)   . GERD (gastroesophageal reflux disease)   . Hyperlipidemia   . Hypertension   . Mild left ventricular hypertrophy   . Unspecified atrial fibrillation (HCC)     There are no active problems to display for this patient.   Past Surgical History:  Procedure Laterality Date  . NO PAST SURGERIES      Prior to Admission medications   Medication Sig Start Date End Date Taking? Authorizing Provider  apixaban (ELIQUIS) 5 MG TABS tablet Take 1 tablet (5 mg total) by mouth 2 (two) times daily. 12/02/15  Yes Minna AntisPaduchowski, Kevin, MD  diltiazem (CARDIZEM) 120 MG tablet Take 120 mg by mouth 4 (four) times daily.   Yes [provider]  glimepiride (AMARYL) 4 MG tablet Take 1 tablet by mouth 2 (two) times daily. 01/18/18  Yes [provider]  lisinopril (PRINIVIL,ZESTRIL) 20 MG tablet Take 20 mg by mouth daily. 01/11/18  Yes [provider]  lovastatin (MEVACOR) 40 MG tablet Take 40 mg by mouth at bedtime.   Yes [provider]  metFORMIN (GLUCOPHAGE) 1000 MG tablet Take 1 tablet by mouth 2 (two) times daily. 12/02/17   Yes [provider]    Allergies No known drug allergies History reviewed. No pertinent family history.  Social History Social History   Tobacco Use  . Smoking status: Former Smoker  Substance Use Topics  . Alcohol use: No  . Drug use: Not on file    Review of Systems Constitutional: No fever/chills Eyes: No visual changes. ENT: No sore throat. Cardiovascular: Denies chest pain.  Palpitations Respiratory: Positive for shortness of breath.   Gastrointestinal: No abdominal pain.  No nausea, no vomiting.  No diarrhea.  No constipation. Genitourinary: Negative for dysuria. Musculoskeletal: Negative for neck pain.  Negative for back pain. Integumentary: Negative for rash. Neurological: Negative for headaches, focal weakness or numbness.   ____________________________________________   PHYSICAL EXAM:  VITAL SIGNS: ED Triage Vitals [04/15/18 2306]  Enc Vitals Group     BP      Pulse      Resp      Temp      Temp src      SpO2      Weight 112.5 kg (248 lb)     Height 1.93 m (6\' 4" )     Head Circumference      Peak Flow      Pain Score 0     Pain Loc      Pain Edu?      Excl. in GC?     Constitutional: Alert and oriented. Well appearing and in no acute distress. Eyes: Conjunctivae are normal. PERRL. EOMI.  Head: Atraumatic. Mouth/Throat: Mucous membranes are moist.  Oropharynx non-erythematous. Neck: No stridor.   Cardiovascular: Normal rate, irregular rhythm. Good peripheral circulation. Grossly normal heart sounds. Respiratory: Normal respiratory effort.  No retractions. Lungs CTAB. Gastrointestinal: Soft and nontender. No distention.  Musculoskeletal: No lower extremity tenderness nor edema. No gross deformities of extremities. Neurologic:  Normal speech and language. No gross focal neurologic deficits are appreciated.  Skin:  Skin is warm, dry and intact. No rash noted. Psychiatric: Mood and affect are normal. Speech and behavior are  normal.  ____________________________________________   LABS (all labs ordered are listed, but only abnormal results are displayed)  Labs Reviewed  COMPREHENSIVE METABOLIC PANEL - Abnormal; Notable for the following components:      Result Value   Glucose, Bld 211 (*)    All other components within normal limits  CBC  TROPONIN I   ____________________________________________  EKG  ED ECG REPORT I, Lino Lakes N Kellan Boehlke, the attending physician, personally viewed and interpreted this ECG.   Date: 04/16/2018  EKG Time:   Rate: 68  Rhythm: Atrial fibrillation  Axis: Normal  Intervals: Irregular RR interval  ST&T Change: None  ________________    Procedures   ____________________________________________   INITIAL IMPRESSION / ASSESSMENT AND PLAN / ED COURSE  As part of my medical decision making, I reviewed the following data within the electronic MEDICAL RECORD NUMBER   57 year old male presented with above-stated history and physical exam consistent with atrial fibrillation with rapid ventricular response.  Patient given 5 of IV metoprolol via EMS with rate control achieved before arrival to the emergency department.  Patient asymptomatic on arrival and remained that way during ED observation.  Patient given diltiazem 120 mg tablet which is his home prescribed dose.  Current heart rate 68 however patient still in A. fib.  Spoke with patient regarding importance of taking diltiazem as prescribed ____________________________________________  FINAL CLINICAL IMPRESSION(S) / ED DIAGNOSES  Final diagnoses:  Atrial fibrillation with rapid ventricular response (HCC)     MEDICATIONS GIVEN DURING THIS VISIT:  Medications  diltiazem (CARDIZEM) tablet 120 mg (120 mg Oral Given 04/15/18 2318)     ED Discharge Orders    None       Note:  This document was prepared using Dragon voice recognition software and may include unintentional dictation errors.    Darci Current,  MD 04/16/18 [phone]

## 2018-05-26 ENCOUNTER — Other Ambulatory Visit: Payer: Self-pay

## 2018-05-26 ENCOUNTER — Ambulatory Visit
Admission: EM | Admit: 2018-05-26 | Discharge: 2018-05-26 | Disposition: A | Discharge status: Home / Self Care | Payer: Worker's Compensation | Attending: Family Medicine | Admitting: Family Medicine

## 2018-05-26 ENCOUNTER — Encounter: Payer: Self-pay | Admitting: Emergency Medicine

## 2018-05-26 DIAGNOSIS — S8992XA Unspecified injury of left lower leg, initial encounter: Secondary | ICD-10-CM | POA: Diagnosis not present

## 2018-05-26 DIAGNOSIS — W208XXA Other cause of strike by thrown, projected or falling object, initial encounter: Secondary | ICD-10-CM | POA: Diagnosis not present

## 2018-05-26 DIAGNOSIS — S8002XA Contusion of left knee, initial encounter: Secondary | ICD-10-CM

## 2018-05-26 NOTE — ED Triage Notes (Signed)
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 Patient states a wheel came off of a basket and hit his left knee today at work.

## 2018-05-26 NOTE — Discharge Instructions (Signed)
Rest, ice, tylenol.  Okay to return to work tomorrow.  Take care Dr. Adriana Simasook

## 2018-05-26 NOTE — ED Provider Notes (Signed)
[facility]  CSN: [id number] Arrival date & time: 05/26/18  1318  History   Chief Complaint Chief Complaint  Patient presents with  . Knee Injury    W/C DOI 05/26/18   HPI  57 year old male presents with left knee injury.  Left knee injury  Was at work today.  Was pushing a cart with a basket.  He states that the basket fell off and injured his left knee.  Patient states that he injured the medial aspect of his left knee.  Pain is mild currently.  No reports of swelling.  No medications or interventions tried.  Patient is minimally bothered by this but was instructed to come in due to it happening at work.  No other associated symptoms.  No other complaints.   Past Medical History:  Diagnosis Date  . Diabetes mellitus without complication (HCC)   . GERD (gastroesophageal reflux disease)   . Hyperlipidemia   . Hypertension   . Mild left ventricular hypertrophy   . Unspecified atrial fibrillation [facility](HCC)    Past Surgical History:  Procedure Laterality Date  . NO PAST SURGERIES     Home Medications    Prior to Admission medications   Medication Sig Start Date End Date Taking? Authorizing Provider  apixaban (ELIQUIS) 5 MG TABS tablet Take 1 tablet (5 mg total) by mouth 2 (two) times daily. 12/02/15  Yes Minna AntisPaduchowski, Kevin, MD  diltiazem (CARDIZEM) 120 MG tablet Take 120 mg by mouth 4 (four) times daily.   Yes [provider]  glimepiride (AMARYL) 4 MG tablet Take 1 tablet by mouth 2 (two) times daily. 01/18/18  Yes [provider]  lisinopril (PRINIVIL,ZESTRIL) 20 MG tablet Take 20 mg by mouth daily. 01/11/18  Yes [provider]  lovastatin (MEVACOR) 40 MG tablet Take 40 mg by mouth at bedtime.   Yes [provider]  metFORMIN (GLUCOPHAGE) 1000 MG tablet Take 1 tablet by mouth 2 (two) times daily. 12/02/17  Yes [provider]   Family History Family History  Problem Relation Age of Onset  . Breast cancer Mother     . Hypertension Mother   . Diabetes Mother   . Heart attack Father   . Hypertension Father   . Diabetes Father    Social History Social History   Tobacco Use  . Smoking status: Former Smoker    Last attempt to quit: 05/26/2008    Years since quitting: 10.0  . Smokeless tobacco: Never Used  Substance Use Topics  . Alcohol use: No  . Drug use: Never   Allergies   Patient has no known allergies.  Review of Systems Review of Systems  Constitutional: Negative.   Musculoskeletal:       Left knee pain.   Physical Exam Triage Vital Signs ED Triage Vitals  Enc Vitals Group     BP 05/26/18 1338 (!) 143/94     Pulse Rate 05/26/18 1338 65     Resp 05/26/18 1338 16     Temp 05/26/18 1338 97.8 F (36.6 C)     Temp Source 05/26/18 1338 Oral     SpO2 05/26/18 1338 98 %     Weight 05/26/18 1338 248 lb (112.5 kg)     Height 05/26/18 1338 6\' 4"  (1.93 m)     Head Circumference --      Peak Flow --      Pain Score 05/26/18 1337 5     Pain Loc --  Pain Edu? --      Excl. in GC? --    Updated Vital Signs BP (!) 143/94 (BP Location: Left Arm)   Pulse 65   Temp 97.8 F (36.6 C) (Oral)   Resp 16   Ht 6\' 4"  (1.93 m)   Wt 112.5 kg   SpO2 98%   BMI 30.19 kg/m    Visual Acuity Right Eye Distance:   Left Eye Distance:   Bilateral Distance:    Right Eye Near:   Left Eye Near:    Bilateral Near:     Physical Exam  Constitutional: He is oriented to person, place, and time. He appears well-developed. No distress.  Cardiovascular: Normal rate and regular rhythm.  Pulmonary/Chest: Effort normal. No respiratory distress.  Musculoskeletal:  Knee: Left  Normal to inspection with no erythema or effusion or obvious bony abnormalities.  Palpation normal with no warmth, joint line tenderness, patellar tenderness, or condyle tenderness. ROM full in flexion and extension and lower leg rotation. Ligaments with solid consistent endpoints including ACL, PCL, LCL, MCL.    Neurological: He is alert and oriented to person, place, and time.  Psychiatric: He has a normal mood and affect. His behavior is normal.  Nursing note and vitals reviewed.  UC Treatments / Results  Labs (all labs ordered are listed, but only abnormal results are displayed) Labs Reviewed - No data to display  EKG None  Radiology No results found.  Procedures Procedures (including critical care time)  Medications Ordered in UC Medications - No data to display  Initial Impression / Assessment and Plan / UC Course  I have reviewed the triage vital signs and the nursing notes.  Pertinent labs & imaging results that were available during my care of the patient were reviewed by me and considered in my medical decision making (see chart for details).    57 year old male presents with a left knee injury.  No need for x-ray.  Advised rest, ice, Tylenol.  Supportive care.  May return to work tomorrow.  Final Clinical Impressions(s) / UC Diagnoses   Final diagnoses:  Injury of left knee, initial encounter  Contusion of left knee, initial encounter     Discharge Instructions     Rest, ice, tylenol.  Okay to return to work tomorrow.  Take care Dr. Adriana Simas    ED Prescriptions    None     Controlled Substance Prescriptions [place] Controlled Substance Registry consulted? Not Applicable  Tommie Sams, DO 05/26/18 1448

## 2019-04-17 ENCOUNTER — Other Ambulatory Visit: Payer: Self-pay

## 2019-04-17 ENCOUNTER — Encounter: Payer: Self-pay | Admitting: Emergency Medicine

## 2019-04-17 ENCOUNTER — Emergency Department
Admission: EM | Admit: 2019-04-17 | Discharge: 2019-04-17 | Disposition: A | Discharge status: Home / Self Care | Payer: BC Managed Care – PPO | Attending: Emergency Medicine | Admitting: Emergency Medicine

## 2019-04-17 DIAGNOSIS — E119 Type 2 diabetes mellitus without complications: Secondary | ICD-10-CM | POA: Diagnosis not present

## 2019-04-17 DIAGNOSIS — Z87891 Personal history of nicotine dependence: Secondary | ICD-10-CM | POA: Insufficient documentation

## 2019-04-17 DIAGNOSIS — Z7901 Long term (current) use of anticoagulants: Secondary | ICD-10-CM | POA: Insufficient documentation

## 2019-04-17 DIAGNOSIS — Z7984 Long term (current) use of oral hypoglycemic drugs: Secondary | ICD-10-CM | POA: Insufficient documentation

## 2019-04-17 DIAGNOSIS — I10 Essential (primary) hypertension: Secondary | ICD-10-CM | POA: Diagnosis present

## 2019-04-17 LAB — BASIC METABOLIC PANEL
Anion gap: 7 (ref 5–15)
BUN: 20 mg/dL (ref 6–20)
CO2: 28 mmol/L (ref 22–32)
Calcium: 9.6 mg/dL (ref 8.9–10.3)
Chloride: 106 mmol/L (ref 98–111)
Creatinine, Ser: 1.35 mg/dL — ABNORMAL HIGH (ref 0.61–1.24)
GFR calc Af Amer: >60 mL/min (ref >60)
GFR calc non Af Amer: 57 mL/min — ABNORMAL LOW (ref >60)
Glucose, Bld: 167 mg/dL — ABNORMAL HIGH (ref 70–99)
Potassium: 4 mmol/L (ref 3.5–5.1)
Sodium: 141 mmol/L (ref 135–145)

## 2019-04-17 LAB — CBC WITH DIFFERENTIAL/PLATELET
Abs Immature Granulocytes: 0.02 10*3/uL (ref 0.00–0.07)
Basophils Absolute: 0.1 10*3/uL (ref 0.0–0.1)
Basophils Relative: 1 %
Eosinophils Absolute: 0.1 10*3/uL (ref 0.0–0.5)
Eosinophils Relative: 1 %
HCT: 48.2 % (ref 39.0–52.0)
Hemoglobin: 16.2 g/dL (ref 13.0–17.0)
Immature Granulocytes: 0 %
Lymphocytes Relative: 27 %
Lymphs Abs: 1.3 10*3/uL (ref 0.7–4.0)
MCH: 28.3 pg (ref 26.0–34.0)
MCHC: 33.6 g/dL (ref 30.0–36.0)
MCV: 84.3 fL (ref 80.0–100.0)
Monocytes Absolute: 0.5 10*3/uL (ref 0.1–1.0)
Monocytes Relative: 10 %
Neutro Abs: 3 10*3/uL (ref 1.7–7.7)
Neutrophils Relative %: 61 %
Platelets: 207 10*3/uL (ref 150–400)
RBC: 5.72 MIL/uL (ref 4.22–5.81)
RDW: 11.9 % (ref 11.5–15.5)
WBC: 4.9 10*3/uL (ref 4.0–10.5)
nRBC: 0 % (ref 0.0–0.2)

## 2019-04-17 NOTE — ED Triage Notes (Signed)
Patient states that he checks his blood pressure every morning and that this morning it was 180/127.

## 2019-04-17 NOTE — Discharge Instructions (Signed)

## 2019-04-17 NOTE — ED Notes (Signed)
Peripheral IV discontinued. Catheter intact. No signs of infiltration or redness. Gauze applied to IV site.   Discharge instructions reviewed with patient. Questions fielded by this RN. Patient verbalizes understanding of instructions. Patient discharged home in stable condition per forbach. No acute distress noted at time of discharge.    

## 2019-04-17 NOTE — ED Provider Notes (Signed)
[facility] Emergency Department Provider Note  ____________________________________________   First MD Initiated Contact with Patient 04/17/19 [phone]     (approximate)  I have reviewed the triage vital signs and the nursing notes.   HISTORY  Chief Complaint Hypertension    HPI Earl Griffin is a 58 y.o. male with medical history as listed below who sees Dr. Doy Mince for primary care.  He has difficult to control hypertension and comes in tonight for an elevated blood pressure measurement at home.  He has to go to work at [employer] and when he checks his blood pressure every morning when he gets up and this morning it was 180/127.  He has no other symptoms.  He specifically denies headache, visual changes, sore throat, chest pain, shortness of breath, nausea, vomiting, abdominal pain, and dysuria.  He said that his blood pressure is been getting higher and higher over the last week.  He admits to some dietary indiscretions including eating some salty and fried food, but says that he only had baked chicken yesterday.  He reports that the blood pressure measurement was severe and gradual in onset over the last week and nothing particular made it better or worse although his blood pressure has come down with subsequent measurements in the ED.         Past Medical History:  Diagnosis Date  . Diabetes mellitus without complication ([place])   . GERD (gastroesophageal reflux disease)   . Hyperlipidemia   . Hypertension   . Mild left ventricular hypertrophy   . Unspecified atrial fibrillation (HCC)     There are no active problems to display for this patient.   Past Surgical History:  Procedure Laterality Date  . NO PAST SURGERIES      Prior to Admission medications   Medication Sig Start Date End Date Taking? Authorizing Provider  apixaban (ELIQUIS) 5 MG TABS tablet Take 1 tablet (5 mg total) by mouth 2 (two) times daily. 12/02/15   Harvest Dark, MD   diltiazem (CARDIZEM) 120 MG tablet Take 120 mg by mouth 4 (four) times daily.    [provider]  glimepiride (AMARYL) 4 MG tablet Take 1 tablet by mouth 2 (two) times daily. 01/18/18   [provider]  lisinopril (PRINIVIL,ZESTRIL) 20 MG tablet Take 40 mg by mouth daily.  01/11/18   [provider]  lovastatin (MEVACOR) 40 MG tablet Take 40 mg by mouth at bedtime.    [provider]  metFORMIN (GLUCOPHAGE) 1000 MG tablet Take 1 tablet by mouth 2 (two) times daily. 12/02/17   [provider]    Allergies Patient has no known allergies.  Family History  Problem Relation Age of Onset  . Breast cancer Mother   . Hypertension Mother   . Diabetes Mother   . Heart attack Father   . Hypertension Father   . Diabetes Father     Social History Social History   Tobacco Use  . Smoking status: Former Smoker    Quit date: 05/26/2008    Years since quitting: 10.8  . Smokeless tobacco: Never Used  Substance Use Topics  . Alcohol use: No  . Drug use: Never    Review of Systems Constitutional: No fever/chills Eyes: No visual changes. ENT: No sore throat. Cardiovascular: Denies chest pain. Respiratory: Denies shortness of breath. Gastrointestinal: No abdominal pain.  No nausea, no vomiting.  No diarrhea.  No constipation. Genitourinary: Negative for dysuria. Musculoskeletal: Negative for neck pain.  Negative for back  pain. Integumentary: Negative for rash. Neurological: Negative for headaches, focal weakness or numbness.   ____________________________________________   PHYSICAL EXAM:  VITAL SIGNS: ED Triage Vitals  Enc Vitals Group     BP 04/17/19 0209 (!) 176/111     Pulse Rate 04/17/19 0209 77     Resp 04/17/19 0209 18     Temp 04/17/19 0209 98.1 F (36.7 C)     Temp Source 04/17/19 0209 Oral     SpO2 04/17/19 0209 98 %     Weight 04/17/19 0210 113.4 kg (250 lb)     Height 04/17/19 0210 1.93 m (6\' 4" )     Head Circumference --       Peak Flow --      Pain Score 04/17/19 0210 0     Pain Loc --      Pain Edu? --      Excl. in GC? --     Constitutional: Alert and oriented. Well appearing and in no acute distress. Eyes: Conjunctivae are normal.  Head: Atraumatic. Nose: No congestion/rhinnorhea. Mouth/Throat: Mucous membranes are moist. Neck: No stridor.  No meningeal signs.   Cardiovascular: Normal rate, regular rhythm. Good peripheral circulation. Grossly normal heart sounds. Respiratory: Normal respiratory effort.  No retractions. No audible wheezing. Gastrointestinal: Soft and nontender. No distention.  Musculoskeletal: No lower extremity tenderness nor edema. No gross deformities of extremities. Neurologic:  Normal speech and language. No gross focal neurologic deficits are appreciated.  Skin:  Skin is warm, dry and intact. No rash noted. Psychiatric: Mood and affect are normal. Speech and behavior are normal.  ____________________________________________   LABS (all labs ordered are listed, but only abnormal results are displayed)  Labs Reviewed  BASIC METABOLIC PANEL - Abnormal; Notable for the following components:      Result Value   Glucose, Bld 167 (*)    Creatinine, Ser 1.35 (*)    GFR calc non Af Amer 57 (*)    All other components within normal limits  CBC WITH DIFFERENTIAL/PLATELET   ____________________________________________  EKG  No indication for EKG ____________________________________________  RADIOLOGY   ED MD interpretation: No indication for imaging  Official radiology report(s): No results found.  ____________________________________________   PROCEDURES   Procedure(s) performed (including Critical Care):  Procedures   ____________________________________________   INITIAL IMPRESSION / MDM / ASSESSMENT AND PLAN / ED COURSE  As part of my medical decision making, I reviewed the following data within the electronic MEDICAL RECORD NUMBER Nursing notes reviewed and  incorporated, Labs reviewed , Old chart reviewed, Notes from prior ED visits   Differential diagnosis includes, but is not limited to, essential hypertension, acute renal dysfunction, hypertensive urgency/emergency, dietary indiscretions.  The patient is well-appearing and in no distress.  He is completely asymptomatic other than the elevated blood pressure measurement at home.  His blood pressure was 176/111 in the ED in triage, but when I went to evaluate him his most recent blood pressure measurement taken while I was in the room was 150/96.  He was reassured by this number but never had any other symptoms to compare her contrast.  His vital signs are otherwise stable and there is no concern for COVID-19.  I checked a CBC and a basic metabolic panel and his creatinine is 1.3 which is consistent with prior measurements in care everywhere over the last 1+ years; it appears that his creatinine varies between about 1.1 and 1.3.  There is no indication of any acute or emergent medical condition at  this time and he does not require intervention.  I had my usual customary discussion with the patient about following up with his PCP, sticking with good eating habits, and continuing his medication until he can follow-up.  He states that he has a follow-up appointment with his PCP in 2 days and I encouraged him to keep the appointment.  I gave my usual precautions and he understands and agrees with the plan.          ____________________________________________  FINAL CLINICAL IMPRESSION(S) / ED DIAGNOSES  Final diagnoses:  Essential hypertension     MEDICATIONS GIVEN DURING THIS VISIT:  Medications - No data to display   ED Discharge Orders    None      *Please note:  Ervin Knackerry Reale was evaluated in Emergency Department on 04/17/2019 for the symptoms described in the history of present illness. He was evaluated in the context of the global COVID-19 pandemic, which necessitated consideration that  the patient might be at risk for infection with the SARS-CoV-2 virus that causes COVID-19. Institutional protocols and algorithms that pertain to the evaluation of patients at risk for COVID-19 are in a state of rapid change based on information released by regulatory bodies including the CDC and federal and state organizations. These policies and algorithms were followed during the patient's care in the ED.  Some ED evaluations and interventions may be delayed as a result of limited staffing during the pandemic.*  Note:  This document was prepared using Dragon voice recognition software and may include unintentional dictation errors.   Loleta RoseForbach, Levonte Molina, MD 04/17/19 [phone]

## 2019-04-17 NOTE — ED Notes (Signed)
Pt with c/o high BP at home, reports hx of same and took 40mg  of Lisinopril (med list updated) this am.  Pt denies neuro deficits, reports regular exercise, HA left upper head earlier but now resolved, reports "sister made chicken pot pie earlier

## 2019-05-16 DIAGNOSIS — U071 COVID-19: Secondary | ICD-10-CM

## 2019-05-16 HISTORY — DX: COVID-19: U07.1

## 2019-05-31 ENCOUNTER — Other Ambulatory Visit: Payer: Self-pay

## 2019-05-31 ENCOUNTER — Emergency Department
Admission: EM | Admit: 2019-05-31 | Discharge: 2019-05-31 | Disposition: A | Discharge status: Home / Self Care | Payer: BC Managed Care – PPO | Attending: Emergency Medicine | Admitting: Emergency Medicine

## 2019-05-31 DIAGNOSIS — Z87891 Personal history of nicotine dependence: Secondary | ICD-10-CM | POA: Insufficient documentation

## 2019-05-31 DIAGNOSIS — E119 Type 2 diabetes mellitus without complications: Secondary | ICD-10-CM | POA: Insufficient documentation

## 2019-05-31 DIAGNOSIS — Z7984 Long term (current) use of oral hypoglycemic drugs: Secondary | ICD-10-CM | POA: Insufficient documentation

## 2019-05-31 DIAGNOSIS — U071 COVID-19: Secondary | ICD-10-CM | POA: Diagnosis not present

## 2019-05-31 DIAGNOSIS — R059 Cough, unspecified: Secondary | ICD-10-CM

## 2019-05-31 DIAGNOSIS — Z7901 Long term (current) use of anticoagulants: Secondary | ICD-10-CM | POA: Insufficient documentation

## 2019-05-31 DIAGNOSIS — R05 Cough: Secondary | ICD-10-CM

## 2019-05-31 DIAGNOSIS — Z79899 Other long term (current) drug therapy: Secondary | ICD-10-CM | POA: Insufficient documentation

## 2019-05-31 DIAGNOSIS — I10 Essential (primary) hypertension: Secondary | ICD-10-CM | POA: Diagnosis not present

## 2019-05-31 DIAGNOSIS — B349 Viral infection, unspecified: Secondary | ICD-10-CM | POA: Diagnosis not present

## 2019-05-31 NOTE — Discharge Instructions (Signed)
Please check COVID results online.  Please quarantine until you have your results.  If cover test is positive you will need to quarantine for 10 days.  If any worsening cough, fevers or shortness of breath you need to return to the emergency department.  If you cover test is negative you may return to work as long as you are afebrile.  If any worsening symptoms such as chest pain shortness of breath difficulty breathing return to the emergency department.

## 2019-05-31 NOTE — ED Triage Notes (Signed)
Pt arrives to ED via POV from home with c/o headache, generalized body aches, and cough x2 days. Pt denies c/o N/V/D or fever; no c/o SHOB or CP. Pt reports cough has been non-productive, dry, and "annoying". Pt denies any known contacts with COVID+ persons. Pt is A&O, in NAD; RR even, regular, and unlabored.

## 2019-05-31 NOTE — ED Provider Notes (Signed)
[place] EMERGENCY DEPARTMENT Provider Note   CSN: [id number] Arrival date & time: 05/31/19  2008     History   Chief Complaint Chief Complaint  Patient presents with  . Cough  . Generalized Body Aches  . Headache    HPI Miracle Criado is a 58 y.o. male presents to the emergency department evaluation of mild headache, body aches and cough.  For 2 days he has had a mild dry nonproductive cough.  He denies any fevers.  Body aches are very mild.  He has not needed to take any medications for his symptoms.  He denies any current headache, neck pain or stiffness.  No abdominal pain, nausea vomiting or diarrhea.  No loss of taste.  He denies any sore throat.  No chest pain or shortness of breath.  He has been eating well.  He is concerned that he may have COVID due to coworkers who have tested positive.  He would like to be tested for COVID.     HPI  Past Medical History:  Diagnosis Date  . Diabetes mellitus without complication ([place])   . GERD (gastroesophageal reflux disease)   . Hyperlipidemia   . Hypertension   . Mild left ventricular hypertrophy   . Unspecified atrial fibrillation (HCC)     There are no active problems to display for this patient.   Past Surgical History:  Procedure Laterality Date  . NO PAST SURGERIES          Home Medications    Prior to Admission medications   Medication Sig Start Date End Date Taking? Authorizing Provider  apixaban (ELIQUIS) 5 MG TABS tablet Take 1 tablet (5 mg total) by mouth 2 (two) times daily. 12/02/15   Harvest Dark, MD  diltiazem (CARDIZEM) 120 MG tablet Take 120 mg by mouth 4 (four) times daily.    [provider]  glimepiride (AMARYL) 4 MG tablet Take 1 tablet by mouth 2 (two) times daily. 01/18/18   [provider]  lisinopril (PRINIVIL,ZESTRIL) 20 MG tablet Take 40 mg by mouth daily.  01/11/18   [provider]  lovastatin (MEVACOR) 40 MG tablet Take 40 mg by mouth  at bedtime.    [provider]  metFORMIN (GLUCOPHAGE) 1000 MG tablet Take 1 tablet by mouth 2 (two) times daily. 12/02/17   [provider]    Family History Family History  Problem Relation Age of Onset  . Breast cancer Mother   . Hypertension Mother   . Diabetes Mother   . Heart attack Father   . Hypertension Father   . Diabetes Father     Social History Social History   Tobacco Use  . Smoking status: Former Smoker    Quit date: 05/26/2008    Years since quitting: 11.0  . Smokeless tobacco: Never Used  Substance Use Topics  . Alcohol use: No  . Drug use: Never     Allergies   Patient has no known allergies.   Review of Systems Review of Systems  Constitutional: Negative for fatigue and fever.  Respiratory: Positive for cough. Negative for shortness of breath, wheezing and stridor.   Cardiovascular: Negative for chest pain.  Gastrointestinal: Negative for diarrhea, nausea and vomiting.  Musculoskeletal: Positive for myalgias.  Skin: Negative for rash and wound.  Neurological: Positive for headaches.     Physical Exam Updated Vital Signs BP (!) 159/89 (BP Location: Left Arm)   Pulse 84   Temp 99 F (37.2 C) (Oral)  Resp 16   Ht 6\' 4"  (1.93 m)   Wt 112.5 kg   SpO2 94%   BMI 30.19 kg/m   Physical Exam Constitutional:      Appearance: He is well-developed.  HENT:     Head: Normocephalic and atraumatic.     Comments: No pharyngeal erythema or exudates.    Mouth/Throat:     Mouth: Mucous membranes are moist.  Eyes:     Extraocular Movements: Extraocular movements intact.     Conjunctiva/sclera: Conjunctivae normal.  Neck:     Musculoskeletal: Normal range of motion and neck supple. No neck rigidity.  Cardiovascular:     Rate and Rhythm: Normal rate.     Heart sounds: Normal heart sounds.  Pulmonary:     Effort: Pulmonary effort is normal. No respiratory distress.     Breath sounds: Normal breath sounds. No stridor. No wheezing  or rhonchi.  Abdominal:     General: There is no distension.     Palpations: Abdomen is soft.     Tenderness: There is no abdominal tenderness.  Musculoskeletal: Normal range of motion.  Lymphadenopathy:     Cervical: No cervical adenopathy.  Skin:    General: Skin is warm.     Findings: No rash.  Neurological:     Mental Status: He is alert and oriented to person, place, and time.  Psychiatric:        Behavior: Behavior normal.        Thought Content: Thought content normal.      ED Treatments / Results  Labs (all labs ordered are listed, but only abnormal results are displayed) Labs Reviewed  NOVEL CORONAVIRUS, NAA (HOSP ORDER, SEND-OUT TO REF LAB; TAT 18-24 HRS)    EKG None  Radiology No results found.  Procedures Procedures (including critical care time)  Medications Ordered in ED Medications - No data to display   Initial Impression / Assessment and Plan / ED Course  I have reviewed the triage vital signs and the nursing notes.  Pertinent labs & imaging results that were available during my care of the patient were reviewed by me and considered in my medical decision making (see chart for details).        58 year old male with mild nonproductive dry cough x2 days with intermittent mild body aches and mild headache.  No fevers, chest pain or shortness of breath.  Patient concerned about possible contacts to patients who have been diagnosed with COVID.  He would like to have a COVID test today.  COVID test obtained today.  Patient understands signs and symptoms to return to the ED for.  Test results should be back within 12 to 18 hours.  Patient will check results in his epic chart.  He will remain home in quarantine until his test has resulted.  He understands signs and symptoms return to ED for. Final Clinical Impressions(s) / ED Diagnoses   Final diagnoses:  Viral illness  Cough    ED Discharge Orders    None       Ronnette JuniperGaines, Thomas C, PA-C 05/31/19  2055    Concha SeFunke, Mary E, MD 06/01/19 [phone]

## 2019-06-02 LAB — NOVEL CORONAVIRUS, NAA (HOSP ORDER, SEND-OUT TO REF LAB; TAT 18-24 HRS): SARS-CoV-2, NAA: DETECTED — AB

## 2019-06-05 ENCOUNTER — Emergency Department: Payer: BC Managed Care – PPO

## 2019-06-05 ENCOUNTER — Emergency Department
Admission: EM | Admit: 2019-06-05 | Discharge: 2019-06-05 | Disposition: A | Discharge status: Other Acute Inpatient Hospital | Payer: BC Managed Care – PPO | Attending: Emergency Medicine | Admitting: Emergency Medicine

## 2019-06-05 ENCOUNTER — Encounter (HOSPITAL_COMMUNITY): Payer: Self-pay

## 2019-06-05 ENCOUNTER — Inpatient Hospital Stay (HOSPITAL_COMMUNITY)
Admission: AD | Admit: 2019-06-05 | Discharge: 2019-06-12 | DRG: 177 | Disposition: A | Discharge status: Home / Self Care | Payer: BC Managed Care – PPO | Source: Other Acute Inpatient Hospital | Attending: Internal Medicine | Admitting: Internal Medicine

## 2019-06-05 ENCOUNTER — Other Ambulatory Visit: Payer: Self-pay

## 2019-06-05 DIAGNOSIS — I10 Essential (primary) hypertension: Secondary | ICD-10-CM | POA: Diagnosis present

## 2019-06-05 DIAGNOSIS — Z833 Family history of diabetes mellitus: Secondary | ICD-10-CM

## 2019-06-05 DIAGNOSIS — Z7984 Long term (current) use of oral hypoglycemic drugs: Secondary | ICD-10-CM | POA: Diagnosis not present

## 2019-06-05 DIAGNOSIS — I482 Chronic atrial fibrillation, unspecified: Secondary | ICD-10-CM | POA: Diagnosis present

## 2019-06-05 DIAGNOSIS — R0602 Shortness of breath: Secondary | ICD-10-CM | POA: Diagnosis present

## 2019-06-05 DIAGNOSIS — J9601 Acute respiratory failure with hypoxia: Secondary | ICD-10-CM | POA: Diagnosis present

## 2019-06-05 DIAGNOSIS — J1289 Other viral pneumonia: Secondary | ICD-10-CM | POA: Diagnosis present

## 2019-06-05 DIAGNOSIS — E119 Type 2 diabetes mellitus without complications: Secondary | ICD-10-CM | POA: Diagnosis not present

## 2019-06-05 DIAGNOSIS — Z7901 Long term (current) use of anticoagulants: Secondary | ICD-10-CM

## 2019-06-05 DIAGNOSIS — K219 Gastro-esophageal reflux disease without esophagitis: Secondary | ICD-10-CM | POA: Diagnosis present

## 2019-06-05 DIAGNOSIS — Z87891 Personal history of nicotine dependence: Secondary | ICD-10-CM | POA: Insufficient documentation

## 2019-06-05 DIAGNOSIS — N183 Chronic kidney disease, stage 3 (moderate): Secondary | ICD-10-CM | POA: Diagnosis present

## 2019-06-05 DIAGNOSIS — R630 Anorexia: Secondary | ICD-10-CM | POA: Diagnosis present

## 2019-06-05 DIAGNOSIS — T380X5A Adverse effect of glucocorticoids and synthetic analogues, initial encounter: Secondary | ICD-10-CM | POA: Diagnosis present

## 2019-06-05 DIAGNOSIS — E1169 Type 2 diabetes mellitus with other specified complication: Secondary | ICD-10-CM | POA: Diagnosis not present

## 2019-06-05 DIAGNOSIS — U071 COVID-19: Secondary | ICD-10-CM | POA: Diagnosis present

## 2019-06-05 DIAGNOSIS — E1165 Type 2 diabetes mellitus with hyperglycemia: Secondary | ICD-10-CM | POA: Diagnosis present

## 2019-06-05 DIAGNOSIS — N179 Acute kidney failure, unspecified: Secondary | ICD-10-CM | POA: Diagnosis present

## 2019-06-05 DIAGNOSIS — Z79899 Other long term (current) drug therapy: Secondary | ICD-10-CM | POA: Diagnosis not present

## 2019-06-05 DIAGNOSIS — E785 Hyperlipidemia, unspecified: Secondary | ICD-10-CM | POA: Diagnosis present

## 2019-06-05 DIAGNOSIS — E875 Hyperkalemia: Secondary | ICD-10-CM | POA: Diagnosis present

## 2019-06-05 DIAGNOSIS — R739 Hyperglycemia, unspecified: Secondary | ICD-10-CM | POA: Diagnosis not present

## 2019-06-05 DIAGNOSIS — Z803 Family history of malignant neoplasm of breast: Secondary | ICD-10-CM

## 2019-06-05 DIAGNOSIS — Z8249 Family history of ischemic heart disease and other diseases of the circulatory system: Secondary | ICD-10-CM | POA: Diagnosis not present

## 2019-06-05 DIAGNOSIS — E871 Hypo-osmolality and hyponatremia: Secondary | ICD-10-CM | POA: Diagnosis present

## 2019-06-05 DIAGNOSIS — E861 Hypovolemia: Secondary | ICD-10-CM | POA: Diagnosis present

## 2019-06-05 LAB — COMPREHENSIVE METABOLIC PANEL
ALT: 16 U/L (ref 0–44)
AST: 20 U/L (ref 15–41)
Albumin: 3.2 g/dL — ABNORMAL LOW (ref 3.5–5.0)
Alkaline Phosphatase: 67 U/L (ref 38–126)
Anion gap: 10 (ref 5–15)
BUN: 18 mg/dL (ref 6–20)
CO2: 25 mmol/L (ref 22–32)
Calcium: 8.4 mg/dL — ABNORMAL LOW (ref 8.9–10.3)
Chloride: 98 mmol/L (ref 98–111)
Creatinine, Ser: 1.37 mg/dL — ABNORMAL HIGH (ref 0.61–1.24)
GFR calc Af Amer: >60 mL/min (ref >60)
GFR calc non Af Amer: 56 mL/min — ABNORMAL LOW (ref >60)
Glucose, Bld: 256 mg/dL — ABNORMAL HIGH (ref 70–99)
Potassium: 4.1 mmol/L (ref 3.5–5.1)
Sodium: 133 mmol/L — ABNORMAL LOW (ref 135–145)
Total Bilirubin: 1 mg/dL (ref 0.3–1.2)
Total Protein: 7.1 g/dL (ref 6.5–8.1)

## 2019-06-05 LAB — CBC WITH DIFFERENTIAL/PLATELET
Abs Immature Granulocytes: 0.03 10*3/uL (ref 0.00–0.07)
Basophils Absolute: 0 10*3/uL (ref 0.0–0.1)
Basophils Relative: 1 %
Eosinophils Absolute: 0 10*3/uL (ref 0.0–0.5)
Eosinophils Relative: 0 %
HCT: 43.9 % (ref 39.0–52.0)
Hemoglobin: 14.9 g/dL (ref 13.0–17.0)
Immature Granulocytes: 1 %
Lymphocytes Relative: 12 %
Lymphs Abs: 0.8 10*3/uL (ref 0.7–4.0)
MCH: 28 pg (ref 26.0–34.0)
MCHC: 33.9 g/dL (ref 30.0–36.0)
MCV: 82.5 fL (ref 80.0–100.0)
Monocytes Absolute: 0.4 10*3/uL (ref 0.1–1.0)
Monocytes Relative: 5 %
Neutro Abs: 5.3 10*3/uL (ref 1.7–7.7)
Neutrophils Relative %: 81 %
Platelets: 226 10*3/uL (ref 150–400)
RBC: 5.32 MIL/uL (ref 4.22–5.81)
RDW: 11.9 % (ref 11.5–15.5)
WBC: 6.5 10*3/uL (ref 4.0–10.5)
nRBC: 0 % (ref 0.0–0.2)

## 2019-06-05 LAB — FIBRIN DERIVATIVES D-DIMER (ARMC ONLY): Fibrin derivatives D-dimer (ARMC): 749.01 ng/mL (FEU) — ABNORMAL HIGH (ref 0.00–499.00)

## 2019-06-05 LAB — BLOOD GAS, VENOUS
Acid-Base Excess: 3.5 mmol/L — ABNORMAL HIGH (ref 0.0–2.0)
Bicarbonate: 29.1 mmol/L — ABNORMAL HIGH (ref 20.0–28.0)
O2 Saturation: 26.7 %
Patient temperature: 37
pCO2, Ven: 47 mmHg (ref 44.0–60.0)
pH, Ven: 7.4 (ref 7.250–7.430)
pO2, Ven: <31 mmHg — CL (ref 32.0–45.0)

## 2019-06-05 LAB — C-REACTIVE PROTEIN: CRP: 12.4 mg/dL — ABNORMAL HIGH (ref <1.0)

## 2019-06-05 LAB — PROCALCITONIN: Procalcitonin: <0.1 ng/mL

## 2019-06-05 MED ORDER — ALBUTEROL SULFATE HFA 108 (90 BASE) MCG/ACT IN AERS
4.0000 | INHALATION_SPRAY | Freq: Once | RESPIRATORY_TRACT | Status: AC
  Administered 2019-06-05: 4 via RESPIRATORY_TRACT
  Filled 2019-06-05: qty 6.7

## 2019-06-05 MED ORDER — METHYLPREDNISOLONE SODIUM SUCC 125 MG IJ SOLR
125.0000 mg | Freq: Once | INTRAMUSCULAR | Status: AC
  Administered 2019-06-05: 18:00:00 125 mg via INTRAVENOUS
  Filled 2019-06-05: qty 2

## 2019-06-05 MED ORDER — SODIUM CHLORIDE 0.9 % IV SOLN
Freq: Once | INTRAVENOUS | Status: AC
  Administered 2019-06-05: 18:00:00 via INTRAVENOUS

## 2019-06-05 NOTE — Progress Notes (Signed)
Took report from [place] at [facility]. Awaiting patients arrival to RM [id number]

## 2019-06-05 NOTE — ED Provider Notes (Signed)
[facility] Emergency Department Provider Note  ____________________________________________   First MD Initiated Contact with Patient 06/05/19 1740     (approximate)  I have reviewed the triage vital signs and the nursing notes.   HISTORY  Chief Complaint Shortness of Breath    HPI Earl Griffin is a 58 y.o. male with past medical history of hypertension, hyperlipidemia, A. fib, here with respiratory distress in the setting of recent diagnosis of COVID-19.  The patient states that his symptoms started last Wednesday.  He states he developed fever, chills, cough, and shortness of breath at that time.  He was tested as an outpatient found out he was +3 days ago.  He states that initially, he felt like he was slowly improving, but over the last 12 hours, has developed acute, worsening shortness of breath with increased work of breathing.  He feels short of breath at rest now.  Denies known lung history.  He does not smoke.  He is not sure whether he has had ongoing fevers.  No sputum production.  No other acute complaints.       Past Medical History:  Diagnosis Date  . Diabetes mellitus without complication ([place])   . GERD (gastroesophageal reflux disease)   . Hyperlipidemia   . Hypertension   . Mild left ventricular hypertrophy   . Unspecified atrial fibrillation (HCC)     There are no active problems to display for this patient.   Past Surgical History:  Procedure Laterality Date  . NO PAST SURGERIES      Prior to Admission medications   Medication Sig Start Date End Date Taking? Authorizing Provider  apixaban (ELIQUIS) 5 MG TABS tablet Take 1 tablet (5 mg total) by mouth 2 (two) times daily. 12/02/15   Harvest Dark, MD  diltiazem (CARDIZEM) 120 MG tablet Take 120 mg by mouth 4 (four) times daily.    [provider]  glimepiride (AMARYL) 4 MG tablet Take 1 tablet by mouth 2 (two) times daily. 01/18/18   [provider]   lisinopril (PRINIVIL,ZESTRIL) 20 MG tablet Take 40 mg by mouth daily.  01/11/18   [provider]  lovastatin (MEVACOR) 40 MG tablet Take 40 mg by mouth at bedtime.    [provider]  metFORMIN (GLUCOPHAGE) 1000 MG tablet Take 1 tablet by mouth 2 (two) times daily. 12/02/17   [provider]    Allergies Patient has no known allergies.  Family History  Problem Relation Age of Onset  . Breast cancer Mother   . Hypertension Mother   . Diabetes Mother   . Heart attack Father   . Hypertension Father   . Diabetes Father     Social History Social History   Tobacco Use  . Smoking status: Former Smoker    Quit date: 05/26/2008    Years since quitting: 11.0  . Smokeless tobacco: Never Used  Substance Use Topics  . Alcohol use: No  . Drug use: Never    Review of Systems  Review of Systems  Constitutional: Positive for chills, fatigue and unexpected weight change. Negative for fever.  HENT: Negative for sore throat.   Respiratory: Positive for cough, shortness of breath and wheezing.   Cardiovascular: Negative for chest pain.  Gastrointestinal: Negative for abdominal pain.  Genitourinary: Negative for flank pain.  Musculoskeletal: Negative for neck pain.  Skin: Negative for rash and wound.  Allergic/Immunologic: Negative for immunocompromised state.  Neurological: Positive for weakness. Negative for numbness.  Hematological: Does not  bruise/bleed easily.  All other systems reviewed and are negative.    ____________________________________________  PHYSICAL EXAM:      VITAL SIGNS: ED Triage Vitals  Enc Vitals Group     BP 06/05/19 1738 116/76     Pulse Rate 06/05/19 1738 81     Resp 06/05/19 1738 (!) 26     Temp 06/05/19 1738 99.1 F (37.3 C)     Temp Source 06/05/19 1738 Oral     SpO2 06/05/19 1738 93 %     Weight 06/05/19 1734 248 lb (112.5 kg)     Height 06/05/19 1734 6\' 4"  (1.93 m)     Head Circumference --      Peak Flow --      Pain  Score 06/05/19 1734 0     Pain Loc --      Pain Edu? --      Excl. in GC? --      Physical Exam Vitals signs and nursing note reviewed.  Constitutional:      General: He is not in acute distress.    Appearance: He is well-developed.  HENT:     Head: Normocephalic and atraumatic.  Eyes:     Conjunctiva/sclera: Conjunctivae normal.  Neck:     Musculoskeletal: Neck supple.  Cardiovascular:     Rate and Rhythm: Normal rate and regular rhythm.     Heart sounds: Normal heart sounds. No murmur. No friction rub.  Pulmonary:     Effort: Tachypnea and respiratory distress present.     Breath sounds: Rales present. No wheezing.     Comments: Increased work of breathing, tachypneic, speaking in 3-4 word sentences, with diffuse rails Abdominal:     General: There is no distension.     Palpations: Abdomen is soft.     Tenderness: There is no abdominal tenderness.  Skin:    General: Skin is warm.     Capillary Refill: Capillary refill takes less than 2 seconds.  Neurological:     Mental Status: He is alert and oriented to person, place, and time.     Motor: No abnormal muscle tone.       ____________________________________________   LABS (all labs ordered are listed, but only abnormal results are displayed)  Labs Reviewed  COMPREHENSIVE METABOLIC PANEL - Abnormal; Notable for the following components:      Result Value   Sodium 133 (*)    Glucose, Bld 256 (*)    Creatinine, Ser 1.37 (*)    Calcium 8.4 (*)    Albumin 3.2 (*)    GFR calc non Af Amer 56 (*)    All other components within normal limits  FIBRIN DERIVATIVES D-DIMER ([facility] ONLY) - Abnormal; Notable for the following components:   Fibrin derivatives D-dimer ([facility]) 749.01 (*)    All other components within normal limits  BLOOD GAS, VENOUS - Abnormal; Notable for the following components:   pO2, Ven <31.0 (*)    Bicarbonate 29.1 (*)    Acid-Base Excess 3.5 (*)    All other components within normal limits  CBC  WITH DIFFERENTIAL/PLATELET  C-REACTIVE PROTEIN  PROCALCITONIN    ____________________________________________  EKG: Normal sinus rhythm, ventricular rate 79, PR 147, QRS 93, QTc 397.  LAD noted.  No acute ST or T-segment changes. ________________________________________  RADIOLOGY All imaging, including plain films, CT scans, and ultrasounds, independently reviewed by me, and interpretations confirmed via formal radiology reads.  ED MD interpretation:   CXR: Multifocal viral PNA likely  Official  radiology report(s): Dg Chest Portable 1 View  Result Date: 06/05/2019 CLINICAL DATA:  Shortness of breath in a COVID-19 positive patient. EXAM: PORTABLE CHEST 1 VIEW COMPARISON:  PA and lateral chest 01/19/2016. FINDINGS: There is patchy bilateral airspace disease with a mainly peripheral distribution. No pneumothorax or pleural fluid. Heart size is normal. No acute or focal bony abnormality. IMPRESSION: Patchy bilateral airspace disease consistent with pneumonia. Viral/atypical infection is within the differential. Electronically Signed   By: Drusilla Kannerhomas  Dalessio M.D.   On: 06/05/2019 18:28    ____________________________________________  PROCEDURES   Procedure(s) performed (including Critical Care):  .Critical Care Performed by: Shaune PollackIsaacs, Livie Vanderhoof, MD Authorized by: Shaune PollackIsaacs, Teah Votaw, MD   Critical care provider statement:    Critical care time (minutes):  35   Critical care time was exclusive of:  Separately billable procedures and treating other patients and teaching time   Critical care was necessary to treat or prevent imminent or life-threatening deterioration of the following conditions:  Circulatory failure, cardiac failure and respiratory failure   Critical care was time spent personally by me on the following activities:  Development of treatment plan with patient or surrogate, discussions with consultants, evaluation of patient's response to treatment, examination of patient,  obtaining history from patient or surrogate, ordering and performing treatments and interventions, ordering and review of laboratory studies, ordering and review of radiographic studies, pulse oximetry, re-evaluation of patient's condition and review of old charts   I assumed direction of critical care for this patient from another provider in my specialty: no      ____________________________________________  INITIAL IMPRESSION / MDM / ASSESSMENT AND PLAN / ED COURSE  As part of my medical decision making, I reviewed the following data within the electronic MEDICAL RECORD NUMBER       *Earl Griffin was evaluated in Emergency Department on 06/05/2019 for the symptoms described in the history of present illness. He was evaluated in the context of the global COVID-19 pandemic, which necessitated consideration that the patient might be at risk for infection with the SARS-CoV-2 virus that causes COVID-19. Institutional protocols and algorithms that pertain to the evaluation of patients at risk for COVID-19 are in a state of rapid change based on information released by regulatory bodies including the CDC and federal and state organizations. These policies and algorithms were followed during the patient's care in the ED.  Some ED evaluations and interventions may be delayed as a result of limited staffing during the pandemic.*      Medical Decision Making: 58 year old male with past medical history of hypertension here with cough, shortness of breath, and fevers.  COVID-19 test positive several days ago.  He is hypoxic here with increased work of breathing.  Chest x-ray confirms multifocal viral pneumonia.  He is afebrile without signs of sepsis here but tachypneic and hypoxic.  Patient given albuterol, IV steroids, cautious fluids, and [facility]  ____________________________________________  FINAL CLINICAL IMPRESSION(S) / ED DIAGNOSES  Final diagnoses:  Acute respiratory failure  with hypoxia (HCC)  Pneumonia due to COVID-19 virus     MEDICATIONS GIVEN DURING THIS VISIT:  Medications  albuterol (VENTOLIN HFA) 108 (90 Base) MCG/ACT inhaler 4 puff (has no administration in time range)  methylPREDNISolone sodium succinate (SOLU-MEDROL) 125 mg/2 mL injection 125 mg (125 mg Intravenous Given 06/05/19 1815)  0.9 %  sodium chloride infusion ( Intravenous New Bag/Given 06/05/19 1815)     ED Discharge Orders    None  Note:  This document was prepared using Dragon voice recognition software and may include unintentional dictation errors.   Shaune PollackIsaacs, Thom Ollinger, MD 06/05/19 Mikle [id number]

## 2019-06-05 NOTE — ED Notes (Signed)
Pt O2 saturation 90% at rest, placed on 2L [place]

## 2019-06-05 NOTE — ED Notes (Signed)
Pt ambulated to bathroom unassisted with steady gait.

## 2019-06-05 NOTE — ED Triage Notes (Signed)
Pt arrives via [facility] for worsening SHOB after being dx with covid on Friday 09/18. Pt reports no chest pain, no fever. Cough is dry and hacking, pt in NAD at this time, A&Ox4

## 2019-06-06 ENCOUNTER — Other Ambulatory Visit: Payer: Self-pay

## 2019-06-06 DIAGNOSIS — I10 Essential (primary) hypertension: Secondary | ICD-10-CM

## 2019-06-06 DIAGNOSIS — E785 Hyperlipidemia, unspecified: Secondary | ICD-10-CM

## 2019-06-06 DIAGNOSIS — R739 Hyperglycemia, unspecified: Secondary | ICD-10-CM

## 2019-06-06 DIAGNOSIS — J9601 Acute respiratory failure with hypoxia: Secondary | ICD-10-CM | POA: Diagnosis present

## 2019-06-06 DIAGNOSIS — N179 Acute kidney failure, unspecified: Secondary | ICD-10-CM

## 2019-06-06 DIAGNOSIS — E1169 Type 2 diabetes mellitus with other specified complication: Secondary | ICD-10-CM

## 2019-06-06 DIAGNOSIS — I482 Chronic atrial fibrillation, unspecified: Secondary | ICD-10-CM

## 2019-06-06 DIAGNOSIS — E871 Hypo-osmolality and hyponatremia: Secondary | ICD-10-CM

## 2019-06-06 LAB — CBC WITH DIFFERENTIAL/PLATELET
Abs Immature Granulocytes: 0.05 10*3/uL (ref 0.00–0.07)
Basophils Absolute: 0 10*3/uL (ref 0.0–0.1)
Basophils Relative: 0 %
Eosinophils Absolute: 0 10*3/uL (ref 0.0–0.5)
Eosinophils Relative: 0 %
HCT: 43.5 % (ref 39.0–52.0)
Hemoglobin: 14.2 g/dL (ref 13.0–17.0)
Immature Granulocytes: 1 %
Lymphocytes Relative: 8 %
Lymphs Abs: 0.5 10*3/uL — ABNORMAL LOW (ref 0.7–4.0)
MCH: 27.8 pg (ref 26.0–34.0)
MCHC: 32.6 g/dL (ref 30.0–36.0)
MCV: 85.1 fL (ref 80.0–100.0)
Monocytes Absolute: 0.1 10*3/uL (ref 0.1–1.0)
Monocytes Relative: 2 %
Neutro Abs: 5.6 10*3/uL (ref 1.7–7.7)
Neutrophils Relative %: 89 %
Platelets: 231 10*3/uL (ref 150–400)
RBC: 5.11 MIL/uL (ref 4.22–5.81)
RDW: 11.9 % (ref 11.5–15.5)
WBC: 6.3 10*3/uL (ref 4.0–10.5)
nRBC: 0 % (ref 0.0–0.2)

## 2019-06-06 LAB — COMPREHENSIVE METABOLIC PANEL
ALT: 17 U/L (ref 0–44)
AST: 17 U/L (ref 15–41)
Albumin: 3.1 g/dL — ABNORMAL LOW (ref 3.5–5.0)
Alkaline Phosphatase: 64 U/L (ref 38–126)
Anion gap: 10 (ref 5–15)
BUN: 25 mg/dL — ABNORMAL HIGH (ref 6–20)
CO2: 22 mmol/L (ref 22–32)
Calcium: 8.6 mg/dL — ABNORMAL LOW (ref 8.9–10.3)
Chloride: 98 mmol/L (ref 98–111)
Creatinine, Ser: 1.31 mg/dL — ABNORMAL HIGH (ref 0.61–1.24)
GFR calc Af Amer: >60 mL/min (ref >60)
GFR calc non Af Amer: 60 mL/min — ABNORMAL LOW (ref >60)
Glucose, Bld: 432 mg/dL — ABNORMAL HIGH (ref 70–99)
Potassium: 5.4 mmol/L — ABNORMAL HIGH (ref 3.5–5.1)
Sodium: 130 mmol/L — ABNORMAL LOW (ref 135–145)
Total Bilirubin: 1.1 mg/dL (ref 0.3–1.2)
Total Protein: 7.1 g/dL (ref 6.5–8.1)

## 2019-06-06 LAB — GLUCOSE, CAPILLARY
Glucose-Capillary: 346 mg/dL — ABNORMAL HIGH (ref 70–99)
Glucose-Capillary: 408 mg/dL — ABNORMAL HIGH (ref 70–99)
Glucose-Capillary: 413 mg/dL — ABNORMAL HIGH (ref 70–99)
Glucose-Capillary: 459 mg/dL — ABNORMAL HIGH (ref 70–99)
Glucose-Capillary: 468 mg/dL — ABNORMAL HIGH (ref 70–99)

## 2019-06-06 LAB — PHOSPHORUS: Phosphorus: 4 mg/dL (ref 2.5–4.6)

## 2019-06-06 LAB — HIV ANTIBODY (ROUTINE TESTING W REFLEX): HIV Screen 4th Generation wRfx: NONREACTIVE

## 2019-06-06 LAB — POTASSIUM: Potassium: 4.8 mmol/L (ref 3.5–5.1)

## 2019-06-06 LAB — MAGNESIUM: Magnesium: 2.1 mg/dL (ref 1.7–2.4)

## 2019-06-06 LAB — D-DIMER, QUANTITATIVE: D-Dimer, Quant: 1.12 ug/mL-FEU — ABNORMAL HIGH (ref 0.00–0.50)

## 2019-06-06 LAB — C-REACTIVE PROTEIN: CRP: 13.4 mg/dL — ABNORMAL HIGH (ref <1.0)

## 2019-06-06 LAB — FERRITIN: Ferritin: 323 ng/mL (ref 24–336)

## 2019-06-06 LAB — ABO/RH: ABO/RH(D): AB POS

## 2019-06-06 MED ORDER — INSULIN ASPART 100 UNIT/ML ~~LOC~~ SOLN
0.0000 [IU] | Freq: Three times a day (TID) | SUBCUTANEOUS | Status: DC
  Administered 2019-06-06: 17:00:00 20 [IU] via SUBCUTANEOUS
  Administered 2019-06-06: 09:00:00 15 [IU] via SUBCUTANEOUS
  Administered 2019-06-06: 13:00:00 20 [IU] via SUBCUTANEOUS
  Administered 2019-06-07: 08:00:00 11 [IU] via SUBCUTANEOUS

## 2019-06-06 MED ORDER — ZINC SULFATE 220 (50 ZN) MG PO CAPS
220.0000 mg | ORAL_CAPSULE | Freq: Every day | ORAL | Status: DC
  Administered 2019-06-06 – 2019-06-12 (×7): 220 mg via ORAL
  Filled 2019-06-06 (×8): qty 1

## 2019-06-06 MED ORDER — INSULIN ASPART 100 UNIT/ML ~~LOC~~ SOLN: 0.0000 [IU] | Freq: Every day | SUBCUTANEOUS | Status: DC

## 2019-06-06 MED ORDER — INSULIN GLARGINE 100 UNIT/ML ~~LOC~~ SOLN
20.0000 [IU] | Freq: Every day | SUBCUTANEOUS | Status: DC
  Administered 2019-06-06: 09:00:00 20 [IU] via SUBCUTANEOUS
  Filled 2019-06-06: qty 0.2

## 2019-06-06 MED ORDER — APIXABAN 5 MG PO TABS
5.0000 mg | ORAL_TABLET | Freq: Two times a day (BID) | ORAL | Status: DC
  Administered 2019-06-06 – 2019-06-12 (×13): 5 mg via ORAL
  Filled 2019-06-06 (×13): qty 1

## 2019-06-06 MED ORDER — INSULIN GLARGINE 100 UNIT/ML ~~LOC~~ SOLN
15.0000 [IU] | Freq: Two times a day (BID) | SUBCUTANEOUS | Status: DC
  Administered 2019-06-06 – 2019-06-07 (×2): 15 [IU] via SUBCUTANEOUS
  Filled 2019-06-06 (×2): qty 0.15

## 2019-06-06 MED ORDER — GUAIFENESIN-DM 100-10 MG/5ML PO SYRP
10.0000 mL | ORAL_SOLUTION | ORAL | Status: DC | PRN
  Administered 2019-06-08: 23:00:00 10 mL via ORAL
  Filled 2019-06-06: qty 10

## 2019-06-06 MED ORDER — FAMOTIDINE 20 MG PO TABS
20.0000 mg | ORAL_TABLET | Freq: Every day | ORAL | Status: DC
  Administered 2019-06-06 – 2019-06-12 (×7): 20 mg via ORAL
  Filled 2019-06-06 (×7): qty 1

## 2019-06-06 MED ORDER — SODIUM CHLORIDE 0.9 % IV SOLN
100.0000 mg | INTRAVENOUS | Status: AC
  Administered 2019-06-07 – 2019-06-10 (×4): 100 mg via INTRAVENOUS
  Filled 2019-06-06 (×4): qty 20

## 2019-06-06 MED ORDER — DILTIAZEM HCL 60 MG PO TABS
120.0000 mg | ORAL_TABLET | Freq: Every day | ORAL | Status: DC
  Administered 2019-06-06 – 2019-06-12 (×7): 120 mg via ORAL
  Filled 2019-06-06 (×8): qty 2

## 2019-06-06 MED ORDER — SODIUM CHLORIDE 0.9 % IV SOLN
200.0000 mg | Freq: Once | INTRAVENOUS | Status: AC
  Administered 2019-06-06: 05:00:00 200 mg via INTRAVENOUS
  Filled 2019-06-06: qty 40

## 2019-06-06 MED ORDER — DM-GUAIFENESIN ER 30-600 MG PO TB12
1.0000 | ORAL_TABLET | Freq: Two times a day (BID) | ORAL | Status: DC
  Administered 2019-06-06 – 2019-06-12 (×12): 1 via ORAL
  Filled 2019-06-06 (×13): qty 1

## 2019-06-06 MED ORDER — VITAMIN C 500 MG PO TABS
500.0000 mg | ORAL_TABLET | Freq: Every day | ORAL | Status: DC
  Administered 2019-06-06 – 2019-06-12 (×7): 500 mg via ORAL
  Filled 2019-06-06 (×7): qty 1

## 2019-06-06 MED ORDER — INSULIN ASPART 100 UNIT/ML ~~LOC~~ SOLN: 0.0000 [IU] | Freq: Three times a day (TID) | SUBCUTANEOUS | Status: DC

## 2019-06-06 MED ORDER — LISINOPRIL 20 MG PO TABS: 40.0000 mg | ORAL_TABLET | Freq: Every day | ORAL | Status: DC

## 2019-06-06 MED ORDER — DEXAMETHASONE SODIUM PHOSPHATE 10 MG/ML IJ SOLN
6.0000 mg | INTRAMUSCULAR | Status: DC
  Administered 2019-06-06 – 2019-06-08 (×3): 6 mg via INTRAVENOUS
  Filled 2019-06-06 (×3): qty 1

## 2019-06-06 MED ORDER — ALBUTEROL SULFATE HFA 108 (90 BASE) MCG/ACT IN AERS
2.0000 | INHALATION_SPRAY | Freq: Four times a day (QID) | RESPIRATORY_TRACT | Status: DC
  Administered 2019-06-06 – 2019-06-12 (×22): 2 via RESPIRATORY_TRACT
  Filled 2019-06-06: qty 6.7

## 2019-06-06 MED ORDER — SODIUM ZIRCONIUM CYCLOSILICATE 10 G PO PACK
10.0000 g | PACK | Freq: Once | ORAL | Status: AC
  Administered 2019-06-06: 13:00:00 10 g via ORAL
  Filled 2019-06-06: qty 1

## 2019-06-06 NOTE — Progress Notes (Signed)
Pt arrived from [facility] around midnight, he is a/ox4 and ambulatory. He denies pain but does report dyspnea on exertion and work of breathing is noted. Pt's LS are clear but diminished t/o lung field. Hospitalist is in to eval patient at this time.

## 2019-06-06 NOTE — Discharge Instructions (Signed)

## 2019-06-06 NOTE — Progress Notes (Signed)
Initial Nutrition Assessment  DOCUMENTATION CODES:   Not applicable  INTERVENTION:    Ensure Max po BID, each supplement provides 150 kcal, 30 grams of protein, and 6 gm CHO.   NUTRITION DIAGNOSIS:   Increased nutrient needs related to acute illness(COVID-19) as evidenced by estimated needs.  GOAL:   Patient will meet greater than or equal to 90% of their needs  MONITOR:   PO intake, Supplement acceptance, Labs  REASON FOR ASSESSMENT:   Malnutrition Screening Tool    ASSESSMENT:   58 yo male admitted with COVID-19 PNA. PMH includes DM-2, GERD, HLD, HTN, A fib, former smoker.   Labs reviewed. Sodium 130 (L), potassium 5.4 (H), glucose 432 (H), BUN 25 (H), creatinine 1.31 (H) CBG: 346 this morning  Medications reviewed and include decadron, novolog, lantus, vitamin C, zinc sulfate.   Patient reports usual weight ~245 lbs. He had been eating poorly for a few days PTA due to decreased appetite associated with acute illness. Today, he reports his appetite is back. He consumed almost everything on his breakfast tray except the grits. Discussed the importance of adequate protein intake and patient agreed to Ensure Max high protein supplement BID.   From review of usual weights, no significant weight changes noted.   NUTRITION - FOCUSED PHYSICAL EXAM:  deferred  Diet Order:   Diet Order            Diet Heart Room service appropriate? Yes; Fluid consistency: Thin  Diet effective now              EDUCATION NEEDS:   Not appropriate for education at this time  Skin:  Skin Assessment: Reviewed RN Assessment  Last BM:  9/20  Height:   Ht Readings from Last 1 Encounters:  06/05/19 6\' 4"  (1.93 m)    Weight:   Wt Readings from Last 1 Encounters:  06/05/19 111.6 kg    Ideal Body Weight:  91.8 kg  BMI:  Body mass index is 29.94 kg/m.  Estimated Nutritional Needs:   Kcal:  2400-2600  Protein:  130-165 g  Fluid:  > 2.4 L    Molli Barrows, RD,  LDN, CNSC Pager [phone] After Hours Pager [phone]

## 2019-06-06 NOTE — Plan of Care (Addendum)
Dr. Josephine Cables notified with BS 468. No Novolog SS coverage at 2100, administer Lantus as scheduled and recheck BS at MN. 0015 BS 459 and Dr. Josephine Cables notified and orders received. 0030 6units Novolog sq given per order. Pt aware of it and verbalizes understanding. 0200 BS 411. Dr. Josephine Cables aware of it and no further orders received.

## 2019-06-06 NOTE — Progress Notes (Signed)
PROGRESS NOTE  Earl Griffin AVW:[id number] DOB: 06-09-1961 DOA: 06/05/2019  PCP: Marisue Ivan, MD  Brief History/Interval Summary: 58 y.o. male with medical history significant for A-Fib on Eliquis, HTN, HLD and T2DM who presented to [facility] ED with worsening shortness of breath.  Patient symptoms initially started on 9/16.  Apparently several of his coworkers were positive for COVID-19.  Patient was noted to desaturate quickly in the emergency department.  Chest x-ray showed bilateral airspace disease.  Patient was hospitalized for further management.    Reason for Visit: Pneumonia secondary to COVID-19.  Acute respiratory failure with hypoxia.  Consultants: None  Procedures: None  Antibiotics: Anti-infectives (From admission, onward)   Start     Dose/Rate Route Frequency Ordered Stop   06/07/19 0800  remdesivir 100 mg in sodium chloride 0.9 % 250 mL IVPB     100 mg 500 mL/hr over 30 Minutes Intravenous Every 24 hours 06/06/19 0220 06/11/19 0759   06/06/19 0330  remdesivir 200 mg in sodium chloride 0.9 % 250 mL IVPB     200 mg 500 mL/hr over 30 Minutes Intravenous Once 06/06/19 0220 06/06/19 0501       Subjective/Interval History: Patient states that he continues to have some shortness of breath but feels better compared to yesterday.  Occasional dry cough.  No chest pain.  No nausea vomiting or diarrhea.    Assessment/Plan:  Acute Hypoxic Resp. Failure due to Acute Covid 19 Viral Illness/pneumonia secondary to COVID-19  COVID-19 Labs  Recent Labs    06/05/19 1808 06/06/19 0310  DDIMER  --  1.12*  FERRITIN  --  323  CRP 12.4* 13.4*    Lab Results  Component Value Date   SARSCOV2NAA DETECTED (A) 05/31/2019     Fever: Afebrile in the last 12 hours. Oxygen requirements: Currently on 3 L of oxygen via nasal cannula.  Saturating in the early 90s. Antibacterials: None Remdesivir: Day 1 today Steroids: Dexamethasone 6 mg daily Diuretics: Not on a scheduled  basis Actemra: Not given yet Vitamin C and Zinc: Continue DVT Prophylaxis: On Eliquis  Patient's respiratory status seems to be stable.  He is requiring 3 L of oxygen currently.  He states that he is feeling slightly better compared to yesterday.  Inflammatory markers noted to be elevated with CRP of 13.4.  Continue Remdesivir and steroids.  Procalcitonin was less than 0.1.  No role for antibacterials.  Since patient is feeling better we will not offer Actemra or convalescent plasma at this time. Prone positioning, incentive spirometry and mobilization as much as possible.  Continue to trend inflammatory markers.  Acute kidney injury with known known history of CKD/hyperkalemia Mild elevation in creatinine noted on admission.  Continue to monitor.  Monitor urine output.  Avoid nephrotoxic agents.  Potassium noted to be elevated.  We will given Lokelma.  Check his labs later today.  History of chronic atrial fibrillation on anticoagulation Continue diltiazem and Eliquis.  Diabetes mellitus type 2, uncontrolled with hyperglycemia HbA1c not checked recently.  We will order for tomorrow morning.  His home glimepiride and metformin on hold.  CBG noted to be significantly elevated this morning.  Steroid is contributing.  We will place him on Lantus insulin along with SSI.  Essential hypertension Monitor blood pressures closely.  Hold his lisinopril for now.  He is on diltiazem.  Hyponatremia Could be due to hypovolemia.  Monitor labs closely.   DVT Prophylaxis: On Eliquis PUD Prophylaxis: Initiate Pepcid Code Status: Full code Family Communication: Discussed  with the patient Disposition Plan: Hopefully home when ready for discharge.  Continue to mobilize.   Medications:  Scheduled: . albuterol  2 puff Inhalation Q6H  . apixaban  5 mg Oral BID  . dexamethasone (DECADRON) injection  6 mg Intravenous Q24H  . dextromethorphan-guaiFENesin  1 tablet Oral BID  . diltiazem  120 mg Oral Daily   . insulin aspart  0-20 Units Subcutaneous TID WC  . insulin aspart  0-5 Units Subcutaneous QHS  . insulin glargine  20 Units Subcutaneous Daily  . sodium zirconium cyclosilicate  10 g Oral Once  . vitamin C  500 mg Oral Daily  . zinc sulfate  220 mg Oral Daily   Continuous: . [START ON 06/07/2019] remdesivir 100 mg in NS 250 mL     [id number]   Objective:  Vital Signs  Vitals:   06/06/19 0320 06/06/19 0400 06/06/19 0802 06/06/19 0841  BP: (!) 140/101 133/85 (!) 144/96 (!) 146/88  Pulse: 81 76 73   Resp: (!) 25 (!) 24 (!) 22   Temp: 98.2 F (36.8 C)  97.7 F (36.5 C)   TempSrc: Oral  Oral   SpO2: 94% 94% 95%   Weight:      Height:        Intake/Output Summary (Last 24 hours) at 06/06/2019 0948 Last data filed at 06/06/2019 0805 Gross per 24 hour  Intake 490 ml  Output 1175 ml  Net -685 ml   Filed Weights   06/05/19 2327  Weight: 111.6 kg    General appearance: Awake alert.  In no distress Resp: Normal effort at rest.  Coarse breath sounds bilaterally with few crackles bilaterally.  No wheezing or rhonchi.   Cardio: S1-S2 is normal regular.  No S3-S4.  No rubs murmurs or bruit GI: Abdomen is soft.  Nontender nondistended.  Bowel sounds are present normal.  No masses organomegaly Extremities: No edema.  Full range of motion of lower extremities. Neurologic: Alert and oriented x3.  No focal neurological deficits.    Lab Results:  Data Reviewed: I have personally reviewed following labs and imaging studies  CBC: Recent Labs  Lab 06/05/19 1801 06/06/19 0310  WBC 6.5 6.3  NEUTROABS 5.3 5.6  HGB 14.9 14.2  HCT 43.9 43.5  MCV 82.5 85.1  PLT 226 008    Basic Metabolic Panel: Recent Labs  Lab 06/05/19 1801 06/06/19 0310  NA 133* 130*  K 4.1 5.4*  CL 98 98  CO2 25 22  GLUCOSE 256* 432*  BUN 18 25*  CREATININE 1.37* 1.31*  CALCIUM 8.4* 8.6*  MG  --  2.1  PHOS  --  4.0    GFR: Estimated Creatinine Clearance: 84.1 mL/min (A)  (by C-G formula based on SCr of 1.31 mg/dL (H)).  Liver Function Tests: Recent Labs  Lab 06/05/19 1801 06/06/19 0310  AST 20 17  ALT 16 17  ALKPHOS 67 64  BILITOT 1.0 1.1  PROT 7.1 7.1  ALBUMIN 3.2* 3.1*     CBG: Recent Labs  Lab 06/06/19 0759  GLUCAP 346*     Anemia Panel: Recent Labs    06/06/19 0310  FERRITIN 323    Recent Results (from the past 240 hour(s))  Novel Coronavirus, NAA (Hosp order, Send-out to Ref Lab; TAT 18-24 hrs     Status: Abnormal   Collection Time: 05/31/19  8:58 PM   Specimen: Nasopharyngeal Swab; Respiratory  Result Value Ref Range Status   SARS-CoV-2, NAA DETECTED (A) NOT DETECTED Final  Comment: (NOTE)                  Client Requested Flag This nucleic acid amplification test was developed and its performance characteristics determined by [employer]. Nucleic acid amplification tests include PCR and TMA. This test has not been FDA cleared or approved. This test has been authorized by FDA under an Emergency Use Authorization (EUA). This test is only authorized for the duration of time the declaration that circumstances exist justifying the authorization of the emergency use of in vitro diagnostic tests for detection of SARS-CoV-2 virus and/or diagnosis of COVID-19 infection under section 564(b)(1) of the Act, 21 U.S.C. 638GYK-5(L) (1), unless the authorization is terminated or revoked sooner. When diagnostic testing is negative, the possibility of a false negative result should be considered in the context of a patient's recent exposures and the presence of clinical signs and symptoms consistent with COVID-19. An individual without symptoms of COVID-  19 and who is not shedding SARS-CoV-2 virus would expect to have a negative (not detected) result in this assay. Performed At: [facility] [address] [place], [place] [id number] Jolene Schimke MD TJ:[id number]    Coronavirus Source NASOPHARYNGEAL  Final     Comment: Performed at [facility], [address] [place] [place], [place] [address]      Radiology Studies: Dg Chest Portable 1 View  Result Date: 06/05/2019 CLINICAL DATA:  Shortness of breath in a COVID-19 positive patient. EXAM: PORTABLE CHEST 1 VIEW COMPARISON:  PA and lateral chest 01/19/2016. FINDINGS: There is patchy bilateral airspace disease with a mainly peripheral distribution. No pneumothorax or pleural fluid. Heart size is normal. No acute or focal bony abnormality. IMPRESSION: Patchy bilateral airspace disease consistent with pneumonia. Viral/atypical infection is within the differential. Electronically Signed   By: Drusilla Kanner M.D.   On: 06/05/2019 18:28       LOS: 1 day   Osvaldo Shipper  [facility] Pager on [email or web address]  06/06/2019, 9:48 AM

## 2019-06-06 NOTE — Plan of Care (Signed)
Dr. Ree Kida of CBG 408.  Ordered Max sliding (20U) plus ordered nighttime Lantus

## 2019-06-06 NOTE — Plan of Care (Signed)
POC reviewed with patient 

## 2019-06-06 NOTE — Progress Notes (Signed)
Remdesivir started, O2 sat at 94% on 4L/[place]

## 2019-06-06 NOTE — Progress Notes (Signed)
Inpatient Diabetes Program Recommendations  AACE/ADA: New Consensus Statement on Inpatient Glycemic Control (2015)  Target Ranges:  Prepandial:   less than 140 mg/dL      Peak postprandial:   less than 180 mg/dL (1-2 hours)      Critically ill patients:  140 - 180 mg/dL   Lab Results  Component Value Date   GLUCAP 413 (H) 06/06/2019   HGBA1C 7.9 (H) 04/19/2012    Review of Glycemic Control Results for Earl Griffin, Earl Griffin (MRN [id number]) as of 06/06/2019 13:02  Ref. Range 06/06/2019 07:59 06/06/2019 12:32  Glucose-Capillary Latest Ref Range: 70 - 99 mg/dL 346 (H) 413 (H)   Diabetes history: Type 2 DM Outpatient Diabetes medications: Metformin 1000 mg BID, Amaryl 4 mg BID Current orders for Inpatient glycemic control: Novolog 0-20 units TID, novolog 0-5 units QHS, Lantus 20 units QD Solumedrol 125 mg x1, Decadron 6 mg QD  Inpatient Diabetes Program Recommendations:    Given glucose trends consider: -Increasing Lantus 15 units BID - Adding Novolog 5 units TID (assuming patient is consuming >50% of meal).   Thanks, Bronson Curb, MSN, RNC-OB Diabetes Coordinator [phone] (8a-5p)

## 2019-06-06 NOTE — Plan of Care (Addendum)
Dr. Text to inform of CBG 413. Ordered to give Max sliding (20U).  Will continue to assess.

## 2019-06-06 NOTE — Progress Notes (Signed)
Pharmacy Brief Note   O:  ALT: 16  CXR: Patchy bilateral airspace disease consistent with pneumonia.  SpO2: 94% on 4 L/min on Nasal Cannula    A/P:  Patient meets requirements for remdesivir therapy. Will start  remdesivir 200 mg IV x 1  followed by 100 mg IV daily x 4 days.  Monitor ALT  Royetta Asal, PharmD, BCPS 06/06/2019 2:19 AM

## 2019-06-06 NOTE — H&P (Signed)
History and Physical  Earl Griffin JOI:[id number] DOB: 12-12-1960 DOA: 06/05/2019  Referring physician:Isaacs, Lysbeth Galas PCP: Dion Body, MD   Patient coming from: [facility]   Chief Complaint: Shortness of breath  HPI: Earl Griffin is a 58 y.o. male with medical history significant for A-Fib on Eliquis, HTN, HLD and T2DM who presents to [facility] ED with worsening shortness of breath which started within 12hrs of presenting to the ED. Patient complained of fever, chills, nonproductive cough, and shortness of breath which started on Wednesday (05/31/2019), due to concerns that several coworkers are positive for COVID, he presented to [facility] ED for COVID test , which resulted positive for SARS-CoV-2. Patient complained of body aches and decreased appetite.  Shortness of breath worsened yesterday and he returned to [facility] ED for further evaluation and management.  He denies chest pain, sputum production, headache, nausea, vomiting or abdominal pain.  ED Course:  In the emergency department, In the ER, O2 sats was in the 90s, though he desaturates quickly to 80s on minimal activity.  ABG showed hypoxia, supplemental Oxygen at 2lpm improved breathing effort. CXR showed patchy bilateral airspace disease consistent with PNA with possible viral/Atypical infection as differential. He was treated with Albuterol, IV solumedrol 125mg  x 1 and IV NS 1L.  Review of Systems: Constitutional: Positive for fatigue, negative for chills and fever.  HENT: Negative for ear pain and sore throat.   Eyes: Negative for pain and visual disturbance.  Respiratory: Positive for dry cough.  Negative for chest tightness.   Cardiovascular: Negative for chest pain and palpitations.  Gastrointestinal: Negative for abdominal pain and vomiting.  Endocrine: Negative for polyphagia and polyuria.  Genitourinary: Negative for decreased urine volume, dysuria, hematuria.   Musculoskeletal: Negative for arthralgias and back pain.  Skin:  Negative for color change and rash.  Allergic/Immunologic: Negative for immunocompromised state.  Neurological: Negative for tremors, syncope, speech difficulty, weakness, light-headedness and headaches.  Hematological: Does not bruise/bleed easily.  All other systems reviewed and are negative   Past Medical History:  Diagnosis Date  . Diabetes mellitus without complication ([place])   . GERD (gastroesophageal reflux disease)   . Hyperlipidemia   . Hypertension   . Mild left ventricular hypertrophy   . Unspecified atrial fibrillation [facility])    Past Surgical History:  Procedure Laterality Date  . NO PAST SURGERIES      Social History:  reports that he quit smoking about 11 years ago. He has never used smokeless tobacco. He reports that he does not drink alcohol or use drugs.   No Known Allergies  Family History  Problem Relation Age of Onset  . Breast cancer Mother   . Hypertension Mother   . Diabetes Mother   . Heart attack Father   . Hypertension Father   . Diabetes Father       Prior to Admission medications   Medication Sig Start Date End Date Taking? Authorizing Provider  apixaban (ELIQUIS) 5 MG TABS tablet Take 1 tablet (5 mg total) by mouth 2 (two) times daily. 12/02/15  Yes Paduchowski, Lennette Bihari, MD  diltiazem (CARDIZEM) 120 MG tablet Take 120 mg by mouth daily.    Yes [provider]  glimepiride (AMARYL) 4 MG tablet Take 1 tablet by mouth 2 (two) times daily. 01/18/18  Yes [provider]  hydrochlorothiazide (HYDRODIURIL) 12.5 MG tablet Take 12.5 mg by mouth daily. 05/17/19  Yes [provider]  lisinopril (ZESTRIL) 40 MG tablet Take 40 mg by mouth daily.  01/11/18  Yes [provider]  metFORMIN (GLUCOPHAGE) 1000 MG tablet Take 1 tablet by mouth 2 (two) times daily. 12/02/17  Yes [provider]    Physical Exam: BP 124/77 (BP Location: Right Arm)   Pulse 75   Temp 98.4 F (36.9 C) (Oral)   Resp (!) 22   Ht 6\' 4"  (1.93 m)    Wt 111.6 kg   SpO2 94%   BMI 29.94 kg/m   . General: 58 y.o. year-old male well developed well nourished in no acute distress.  Alert and oriented x3. Marland Kitchen HEENT: Normocephalic, atraumatic . NECK: Supple, trachea medial, normal range of motion . Cardiovascular: Regular rate and rhythm with no rubs or gallops.  No JVD noted.  No lower extremity edema. 2/4 pulses in all 4 extremities. Marland Kitchen Respiratory: Tachypnea, decreased breath sounds with no audible wheezing bilaterally. .  Abdomen: Soft nontender nondistended with normal bowel sounds x4 quadrants. . Muskuloskeletal: No cyanosis, clubbing or edema noted bilaterally . Neuro: CN II-XII intact, strength, sensation, reflexes . Skin: No ulcerative lesions noted or rashes . Psychiatry: Judgement and insight appear normal. Mood is appropriate for condition and setting          Labs on Admission:  Basic Metabolic Panel: Recent Labs  Lab 06/05/19 1801  NA 133*  K 4.1  CL 98  CO2 25  GLUCOSE 256*  BUN 18  CREATININE 1.37*  CALCIUM 8.4*   Liver Function Tests: Recent Labs  Lab 06/05/19 1801  AST 20  ALT 16  ALKPHOS 67  BILITOT 1.0  PROT 7.1  ALBUMIN 3.2*   No results for input(s): LIPASE, AMYLASE in the last 168 hours. No results for input(s): AMMONIA in the last 168 hours. CBC: Recent Labs  Lab 06/05/19 1801  WBC 6.5  NEUTROABS 5.3  HGB 14.9  HCT 43.9  MCV 82.5  PLT 226   Cardiac Enzymes: No results for input(s): CKTOTAL, CKMB, CKMBINDEX, TROPONINI in the last 168 hours.  BNP (last 3 results) No results for input(s): BNP in the last 8760 hours.  ProBNP (last 3 results) No results for input(s): PROBNP in the last 8760 hours.  CBG: No results for input(s): GLUCAP in the last 168 hours.  Radiological Exams on Admission: Dg Chest Portable 1 View  Result Date: 06/05/2019 CLINICAL DATA:  Shortness of breath in a COVID-19 positive patient. EXAM: PORTABLE CHEST 1 VIEW COMPARISON:  PA and lateral chest 01/19/2016.  FINDINGS: There is patchy bilateral airspace disease with a mainly peripheral distribution. No pneumothorax or pleural fluid. Heart size is normal. No acute or focal bony abnormality. IMPRESSION: Patchy bilateral airspace disease consistent with pneumonia. Viral/atypical infection is within the differential. Electronically Signed   By: Drusilla Kanner M.D.   On: 06/05/2019 18:28    EKG: I independently viewed the EKG done and my findings are as followed: Sinus rhythm at rate of 79bpm  Assessment/Plan Present on Admission: . Acute respiratory failure with hypoxia (HCC)  Active Problems:   Acute respiratory failure with hypoxia (HCC)   Chronic a-fib   Essential hypertension   Type 2 diabetes mellitus with hyperlipidemia (HCC)   Hyperglycemia   AKI (acute kidney injury) (HCC)   Hyponatremia   Acute respiratory failure with hypoxia secondary to COVID-19 viral pneumonia ABG: pH 7.40, PCO2 47, PO2 <31, bicarb 29.1 CXR showed patchy bilateral airspace disease consistent with PNA with possible viral/Atypical infection as differential Continue albuterol Q6H Continue IV Decadron daily Remdesivir per pharmacy protocol Continue vit. C 500 mg and Zn 220  mg daily Continue mucolytics and antitussives Continue incentive spirometry Continue to obtain daily COVID inflammatory markers and adjust accordingly Continue supplemental oxygen via [place] to obtain SPO2 > 93%.   Acute kidney injury with no known history of CKD Creatinine on admission= 1.37, this was 1.35 about a month ago, but creatinine level was normal about a year ago with baseline creatinine = 1.1-1.2.  Renally adjust medications, avoid nephrotoxic agents/dehydration/hypotension  Chronic atrial fibrillation on Eliquis CHADs-VASc score 2 Continue diltiazem and Eliquis  Hyperglycemia secondary to poorly controlled type II diabetic mellitus Continue insulin sliding scale and hypoglycemia protocol Glimepiride and metformin will be held at  this time  Essential hypertension Continue Cardizem and lisinopril  Hyperlipidemia No antihyperlipidemic drug on patient's home meds, we shall await updated med rec  Hyponatremia  Stable, continue to monitor sodium with morning labs  DVT prophylaxis: Eliquis, SCDs  Code Status: Full code  Family Communication: None at bedside  Disposition Plan: Plan to discharge home once clinically stable  Consults called: None  Admission status: Inpatient   Frankey Shown MD [facility]  If 7PM-7AM, please contact night-coverage [email or web address]   06/06/2019, 1:39 AM

## 2019-06-07 LAB — CBC WITH DIFFERENTIAL/PLATELET
Abs Immature Granulocytes: 0.18 10*3/uL — ABNORMAL HIGH (ref 0.00–0.07)
Basophils Absolute: 0 10*3/uL (ref 0.0–0.1)
Basophils Relative: 0 %
Eosinophils Absolute: 0 10*3/uL (ref 0.0–0.5)
Eosinophils Relative: 0 %
HCT: 42.4 % (ref 39.0–52.0)
Hemoglobin: 14.3 g/dL (ref 13.0–17.0)
Immature Granulocytes: 2 %
Lymphocytes Relative: 9 %
Lymphs Abs: 0.9 10*3/uL (ref 0.7–4.0)
MCH: 28.3 pg (ref 26.0–34.0)
MCHC: 33.7 g/dL (ref 30.0–36.0)
MCV: 83.8 fL (ref 80.0–100.0)
Monocytes Absolute: 0.6 10*3/uL (ref 0.1–1.0)
Monocytes Relative: 6 %
Neutro Abs: 8.1 10*3/uL — ABNORMAL HIGH (ref 1.7–7.7)
Neutrophils Relative %: 83 %
Platelets: 303 10*3/uL (ref 150–400)
RBC: 5.06 MIL/uL (ref 4.22–5.81)
RDW: 11.9 % (ref 11.5–15.5)
WBC: 9.8 10*3/uL (ref 4.0–10.5)
nRBC: 0 % (ref 0.0–0.2)

## 2019-06-07 LAB — FERRITIN: Ferritin: 371 ng/mL — ABNORMAL HIGH (ref 24–336)

## 2019-06-07 LAB — COMPREHENSIVE METABOLIC PANEL
ALT: 16 U/L (ref 0–44)
AST: 13 U/L — ABNORMAL LOW (ref 15–41)
Albumin: 3 g/dL — ABNORMAL LOW (ref 3.5–5.0)
Alkaline Phosphatase: 68 U/L (ref 38–126)
Anion gap: 9 (ref 5–15)
BUN: 36 mg/dL — ABNORMAL HIGH (ref 6–20)
CO2: 25 mmol/L (ref 22–32)
Calcium: 9.1 mg/dL (ref 8.9–10.3)
Chloride: 102 mmol/L (ref 98–111)
Creatinine, Ser: 1.18 mg/dL (ref 0.61–1.24)
GFR calc Af Amer: >60 mL/min (ref >60)
GFR calc non Af Amer: >60 mL/min (ref >60)
Glucose, Bld: 347 mg/dL — ABNORMAL HIGH (ref 70–99)
Potassium: 5.2 mmol/L — ABNORMAL HIGH (ref 3.5–5.1)
Sodium: 136 mmol/L (ref 135–145)
Total Bilirubin: 0.8 mg/dL (ref 0.3–1.2)
Total Protein: 7.2 g/dL (ref 6.5–8.1)

## 2019-06-07 LAB — GLUCOSE, CAPILLARY
Glucose-Capillary: 411 mg/dL — ABNORMAL HIGH (ref 70–99)
Glucose-Capillary: 286 mg/dL — ABNORMAL HIGH (ref 70–99)
Glucose-Capillary: 417 mg/dL — ABNORMAL HIGH (ref 70–99)
Glucose-Capillary: 389 mg/dL — ABNORMAL HIGH (ref 70–99)
Glucose-Capillary: 476 mg/dL — ABNORMAL HIGH (ref 70–99)
Glucose-Capillary: 399 mg/dL — ABNORMAL HIGH (ref 70–99)
Glucose-Capillary: 362 mg/dL — ABNORMAL HIGH (ref 70–99)
Glucose-Capillary: 356 mg/dL — ABNORMAL HIGH (ref 70–99)
Glucose-Capillary: 333 mg/dL — ABNORMAL HIGH (ref 70–99)
Glucose-Capillary: 264 mg/dL — ABNORMAL HIGH (ref 70–99)
Glucose-Capillary: 199 mg/dL — ABNORMAL HIGH (ref 70–99)
Glucose-Capillary: 251 mg/dL — ABNORMAL HIGH (ref 70–99)

## 2019-06-07 LAB — HEMOGLOBIN A1C
Hgb A1c MFr Bld: 10 % — ABNORMAL HIGH (ref 4.8–5.6)
Mean Plasma Glucose: 240 mg/dL

## 2019-06-07 LAB — GLUCOSE, RANDOM: Glucose, Bld: 431 mg/dL — ABNORMAL HIGH (ref 70–99)

## 2019-06-07 LAB — D-DIMER, QUANTITATIVE: D-Dimer, Quant: 0.72 ug/mL-FEU — ABNORMAL HIGH (ref 0.00–0.50)

## 2019-06-07 LAB — C-REACTIVE PROTEIN: CRP: 7.1 mg/dL — ABNORMAL HIGH (ref <1.0)

## 2019-06-07 LAB — MAGNESIUM: Magnesium: 2.6 mg/dL — ABNORMAL HIGH (ref 1.7–2.4)

## 2019-06-07 MED ORDER — INSULIN ASPART 100 UNIT/ML IV SOLN
6.0000 [IU] | Freq: Once | INTRAVENOUS | Status: AC
  Administered 2019-06-07: 01:00:00 6 [IU] via INTRAVENOUS

## 2019-06-07 MED ORDER — INSULIN REGULAR BOLUS VIA INFUSION
0.0000 [IU] | Freq: Three times a day (TID) | INTRAVENOUS | Status: DC
  Administered 2019-06-07: 18:00:00 7.9 [IU] via INTRAVENOUS
  Filled 2019-06-07: qty 10

## 2019-06-07 MED ORDER — ENSURE MAX PROTEIN PO LIQD
11.0000 [oz_av] | Freq: Two times a day (BID) | ORAL | Status: DC
  Administered 2019-06-07 – 2019-06-12 (×9): 11 [oz_av] via ORAL
  Filled 2019-06-07 (×13): qty 330

## 2019-06-07 MED ORDER — DEXTROSE 50 % IV SOLN: 25.0000 mL | INTRAVENOUS | Status: DC | PRN

## 2019-06-07 MED ORDER — INSULIN REGULAR(HUMAN) IN NACL 100-0.9 UT/100ML-% IV SOLN
INTRAVENOUS | Status: DC
  Administered 2019-06-07: 14:00:00 3.3 [IU]/h via INTRAVENOUS
  Administered 2019-06-07: 22:00:00 9.7 [IU]/h via INTRAVENOUS
  Filled 2019-06-07 (×2): qty 100

## 2019-06-07 MED ORDER — INSULIN GLARGINE 100 UNIT/ML ~~LOC~~ SOLN
25.0000 [IU] | Freq: Two times a day (BID) | SUBCUTANEOUS | Status: DC
  Administered 2019-06-07: 22:00:00 25 [IU] via SUBCUTANEOUS
  Filled 2019-06-07 (×2): qty 0.25

## 2019-06-07 MED ORDER — SODIUM CHLORIDE 0.9 % IV SOLN
INTRAVENOUS | Status: DC
  Administered 2019-06-07: 14:00:00 via INTRAVENOUS

## 2019-06-07 MED ORDER — SODIUM ZIRCONIUM CYCLOSILICATE 5 G PO PACK
5.0000 g | PACK | Freq: Once | ORAL | Status: AC
  Administered 2019-06-07: 14:00:00 5 g via ORAL
  Filled 2019-06-07: qty 1

## 2019-06-07 MED ORDER — POLYETHYLENE GLYCOL 3350 17 G PO PACK
17.0000 g | PACK | Freq: Two times a day (BID) | ORAL | Status: AC
  Administered 2019-06-07 – 2019-06-08 (×3): 17 g via ORAL
  Filled 2019-06-07 (×4): qty 1

## 2019-06-07 MED ORDER — DEXTROSE-NACL 5-0.45 % IV SOLN
INTRAVENOUS | Status: DC
  Administered 2019-06-07: 22:00:00 via INTRAVENOUS

## 2019-06-07 NOTE — Progress Notes (Signed)
Earl Griffin updated 

## 2019-06-07 NOTE — TOC Initial Note (Signed)
Transition of Care [facility]) - Initial/Assessment Note    Patient Details  Name: Riel Hirschman MRN: [id number] Date of Birth: 01-12-1961  Transition of [facility]) CM/SW Contact:    Ninfa Meeker, RN Phone Number: [phone] (working remotely) 06/07/2019, 2:29 PM  Clinical Narrative:   58 yr old gentleman admitted for treatment of COVID 19. Patient is from home. On oxygen at 3L [place], started on Remdesivir, IV steriods. Case manager will continue to monitor for appropriate disposition as patient medically improves. May he be blessed to do so.                       Patient Goals and CMS Choice        Expected Discharge Plan and Services                                                Prior Living Arrangements/Services                       Activities of Daily Living Home Assistive Devices/Equipment: Eyeglasses ADL Screening (condition at time of admission) Patient's cognitive ability adequate to safely complete daily activities?: No Is the patient deaf or have difficulty hearing?: No Does the patient have difficulty seeing, even when wearing glasses/contacts?: No Does the patient have difficulty concentrating, remembering, or making decisions?: No Patient able to express need for assistance with ADLs?: No Does the patient have difficulty dressing or bathing?: No Independently performs ADLs?: Yes (appropriate for developmental age) Does the patient have difficulty walking or climbing stairs?: No Weakness of Legs: None Weakness of Arms/Hands: None  Permission Sought/Granted                  Emotional Assessment              Admission diagnosis:  COVID-19 [U07.1, J98.8] Hypoxia [R09.02] Patient Active Problem List   Diagnosis Date Noted  . Acute respiratory failure with hypoxia ([place]) 06/06/2019  . Chronic a-fib 06/06/2019  . Essential hypertension 06/06/2019  . Type 2 diabetes mellitus with hyperlipidemia ([place]) 06/06/2019  .  Hyperglycemia 06/06/2019  . AKI (acute kidney injury) ([place]) 06/06/2019  . Hyponatremia 06/06/2019   PCP:  Dion Body, MD Pharmacy:   [id number] [address], [place] [address] Phone: [phone] Fax: [phone]  [id number] [place], [place] [address] [address] Phone: [phone] Fax: [phone]     Social Determinants of Health (SDOH) Interventions    Readmission Risk Interventions No flowsheet data found.

## 2019-06-07 NOTE — Progress Notes (Signed)
Inpatient Diabetes Program Recommendations  AACE/ADA: New Consensus Statement on Inpatient Glycemic Control (2015)  Target Ranges:  Prepandial:   less than 140 mg/dL      Peak postprandial:   less than 180 mg/dL (1-2 hours)      Critically ill patients:  140 - 180 mg/dL   Lab Results  Component Value Date   GLUCAP 286 (H) 06/07/2019   HGBA1C 10.0 (H) 06/06/2019    Review of Glycemic Control Results for [name], [name] (MRN [id number]) as of 06/07/2019 10:03  Ref. Range 06/06/2019 20:50 06/06/2019 23:41 06/07/2019 02:03 06/07/2019 07:28  Glucose-Capillary Latest Ref Range: 70 - 99 mg/dL 468 (H) 459 (H) 411 (H) 286 (H)    Diabetes history: Type 2 DM Outpatient Diabetes medications: Metformin 1000 mg BID, Amaryl 4 mg BID Current orders for Inpatient glycemic control: Novolog 0-20 units TID, novolog 0-5 units QHS, Lantus 25 units BID Solumedrol 125 mg x1, Decadron 6 mg QD  Inpatient Diabetes Program Recommendations:  Glucose trends continue to exceed 300-400's mg/dL in the setting of steroids. Noted insulin changes this am.   Also, consider adding Novolog 5 units TID (assuming patient is consuming >50% of meal).   Thanks, Bronson Curb, MSN, RNC-OB Diabetes Coordinator [phone] (8a-5p)

## 2019-06-07 NOTE — Progress Notes (Signed)
[facility] PROGRESS NOTE    Progress Note  Earl Griffin  GEZ:[id number] DOB: 22-Jun-1961 DOA: 06/05/2019 PCP: Marisue Ivan, MD     Brief Narrative:   Earl Griffin is an 58 y.o. male past medical history significant for atrial fibrillation on Eliquis, essential hypertension diabetes mellitus type 2 presents with [facility] ED with worsening shortness of breath was found to be hypoxic, he relates his symptoms initially started on 05/31/2019 which progressively got worse in the ED chest x-ray showed bilateral infiltrates.  Assessment/Plan:   Acute respiratory failure with hypoxia (HCC) likely due to COVID-19 viral infection: Last 24 hours, he is currently requiring 3 L of nasal cannula oxygen via nasal cannula to keep saturations greater than 92%. SARS-CoV-2 test was positive on 05/31/2019. His inflammatory markers are slowly improving. He was started on IV Remdesivir and steroids on 06/06/2019 Vitamin C and zinc, procalcitonin was less than 1. Since patient is improving and he relates his breathing is better he was not a candidate for Actemra convalescent plasma. Keep the patient prone for at least 16 hours a day. Continue incentive spirometry and flutter valve.  Acute kidney injury on chronic kidney disease stage III/hyperkalemia: With a baseline creatinine of 1.0, on admission 1.3 patient was hydrated and creatinine has returned to baseline. Noted to be elevated he was given [facility] basic metabolic panel is pending today.  Chronic atrial fibrillation on anticoagulation: Rate control on diltiazem continue Eliquis.  Diabetes mellitus type 2, uncontrolled with hyperglycemia: His A1c is still pending, oral hypoglycemic agents were held on admission, his glucose this morning is 400 will start him on an insulin drip. Likely due to steroids.  Essential hypertension: Holding lisinopril as he was in acute renal failure, continue diltiazem blood pressure is fairly controlled.   Hypovolemic hyponatremia: Resolved with IV fluid hydration.   DVT prophylaxis: lovenox Family Communication:none Disposition Plan/Barrier to D/C: once off oxygen Code Status:     Code Status Orders  (From admission, onward)         Start     Ordered   06/06/19 0104  Full code  Continuous     06/06/19 0108        Code Status History    This patient has a current code status but no historical code status.   Advance Care Planning Activity        IV Access:    Peripheral IV   Procedures and diagnostic studies:   Dg Chest Portable 1 View  Result Date: 06/05/2019 CLINICAL DATA:  Shortness of breath in a COVID-19 positive patient. EXAM: PORTABLE CHEST 1 VIEW COMPARISON:  PA and lateral chest 01/19/2016. FINDINGS: There is patchy bilateral airspace disease with a mainly peripheral distribution. No pneumothorax or pleural fluid. Heart size is normal. No acute or focal bony abnormality. IMPRESSION: Patchy bilateral airspace disease consistent with pneumonia. Viral/atypical infection is within the differential. Electronically Signed   By: Drusilla Kanner M.D.   On: 06/05/2019 18:28     Medical Consultants:    None.  Anti-Infectives:   None  Subjective:    Earl Griffin relates his breathing is about the same as yesterday.  Still anorexic.  Objective:    Vitals:   06/06/19 2010 06/06/19 2340 06/07/19 0400 06/07/19 0729  BP: (!) 142/91 (!) 124/91 (!) 139/96 (!) 128/93  Pulse: 78 68  71  Resp: 18 19  (!) 21  Temp: 97.7 F (36.5 C) 98 F (36.7 C) 98 F (36.7 C) 98.1 F (36.7 C)  TempSrc:  Oral Oral Oral Oral  SpO2: 92% 93%  95%  Weight:      Height:       SpO2: 95 % O2 Flow Rate (L/min): 3 L/min   Intake/Output Summary (Last 24 hours) at 06/07/2019 0924 Last data filed at 06/07/2019 0800 Gross per 24 hour  Intake 1200 ml  Output 3675 ml  Net -2475 ml   Filed Weights   06/05/19 2327  Weight: 111.6 kg    Exam: General exam: In no acute  distress. Respiratory system: Good air movement and diffuse crackles bilaterally. Cardiovascular system: S1 & S2 heard, RRR. No JVD. Gastrointestinal system: Abdomen is nondistended, soft and nontender.  Central nervous system: Alert and oriented. No focal neurological deficits. Extremities: No pedal edema. Skin: No rashes, lesions or ulcers Psychiatry: Judgement and insight appear normal. Mood & affect appropriate.   Data Reviewed:    Labs: Basic Metabolic Panel: Recent Labs  Lab 06/05/19 1801 06/06/19 0310 06/06/19 1409 06/07/19 0425  NA 133* 130*  --  136  K 4.1 5.4* 4.8 5.2*  CL 98 98  --  102  CO2 25 22  --  25  GLUCOSE 256* 432*  --  347*  BUN 18 25*  --  36*  CREATININE 1.37* 1.31*  --  1.18  CALCIUM 8.4* 8.6*  --  9.1  MG  --  2.1  --  2.6*  PHOS  --  4.0  --   --    GFR Estimated Creatinine Clearance: 93.3 mL/min (by C-G formula based on SCr of 1.18 mg/dL). Liver Function Tests: Recent Labs  Lab 06/05/19 1801 06/06/19 0310 06/07/19 0425  AST 20 17 13*  ALT 16 17 16   ALKPHOS 67 64 68  BILITOT 1.0 1.1 0.8  PROT 7.1 7.1 7.2  ALBUMIN 3.2* 3.1* 3.0*   No results for input(s): LIPASE, AMYLASE in the last 168 hours. No results for input(s): AMMONIA in the last 168 hours. Coagulation profile No results for input(s): INR, PROTIME in the last 168 hours. COVID-19 Labs  Recent Labs    06/05/19 1808 06/06/19 0310 06/07/19 0425  DDIMER  --  1.12* 0.72*  FERRITIN  --  323 371*  CRP 12.4* 13.4* 7.1*    Lab Results  Component Value Date   SARSCOV2NAA DETECTED (A) 05/31/2019    CBC: Recent Labs  Lab 06/05/19 1801 06/06/19 0310 06/07/19 0425  WBC 6.5 6.3 9.8  NEUTROABS 5.3 5.6 8.1*  HGB 14.9 14.2 14.3  HCT 43.9 43.5 42.4  MCV 82.5 85.1 83.8  PLT 226 231 303   Cardiac Enzymes: No results for input(s): CKTOTAL, CKMB, CKMBINDEX, TROPONINI in the last 168 hours. BNP (last 3 results) No results for input(s): PROBNP in the last 8760 hours. CBG:  Recent Labs  Lab 06/06/19 1621 06/06/19 2050 06/06/19 2341 06/07/19 0203 06/07/19 0728  GLUCAP 408* 468* 459* 411* 286*   D-Dimer: Recent Labs    06/06/19 0310 06/07/19 0425  DDIMER 1.12* 0.72*   Hgb A1c: Recent Labs    06/06/19 0310  HGBA1C 10.0*   Lipid Profile: No results for input(s): CHOL, HDL, LDLCALC, TRIG, CHOLHDL, LDLDIRECT in the last 72 hours. Thyroid function studies: No results for input(s): TSH, T4TOTAL, T3FREE, THYROIDAB in the last 72 hours.  Invalid input(s): FREET3 Anemia work up: Recent Labs    06/06/19 0310 06/07/19 0425  FERRITIN 323 371*   Sepsis Labs: Recent Labs  Lab 06/05/19 1801 06/06/19 0310 06/07/19 0425  PROCALCITON <0.10  --   --  WBC 6.5 6.3 9.8   Microbiology Recent Results (from the past 240 hour(s))  Novel Coronavirus, NAA (Hosp order, Send-out to Ref Lab; TAT 18-24 hrs     Status: Abnormal   Collection Time: 05/31/19  8:58 PM   Specimen: Nasopharyngeal Swab; Respiratory  Result Value Ref Range Status   SARS-CoV-2, NAA DETECTED (A) NOT DETECTED Final    Comment: (NOTE)                  Client Requested Flag This nucleic acid amplification test was developed and its performance characteristics determined by [employer]. Nucleic acid amplification tests include PCR and TMA. This test has not been FDA cleared or approved. This test has been authorized by FDA under an Emergency Use Authorization (EUA). This test is only authorized for the duration of time the declaration that circumstances exist justifying the authorization of the emergency use of in vitro diagnostic tests for detection of SARS-CoV-2 virus and/or diagnosis of COVID-19 infection under section 564(b)(1) of the Act, 21 U.S.C. 161WRU-0(A) (1), unless the authorization is terminated or revoked sooner. When diagnostic testing is negative, the possibility of a false negative result should be considered in the context of a patient's recent exposures  and the presence of clinical signs and symptoms consistent with COVID-19. An individual without symptoms of COVID-  19 and who is not shedding SARS-CoV-2 virus would expect to have a negative (not detected) result in this assay. Performed At: [facility] [place], [place] [id number] Rush Farmer MD YN:[id number]    [place]  Final    Comment: Performed at [facility], [address] [place], [place] [address]     Medications:   . albuterol  2 puff Inhalation Q6H  . apixaban  5 mg Oral BID  . dexamethasone (DECADRON) injection  6 mg Intravenous Q24H  . dextromethorphan-guaiFENesin  1 tablet Oral BID  . diltiazem  120 mg Oral Daily  . famotidine  20 mg Oral Daily  . insulin aspart  0-20 Units Subcutaneous TID WC  . insulin aspart  0-5 Units Subcutaneous QHS  . insulin glargine  15 Units Subcutaneous BID  . Ensure Max Protein  11 oz Oral BID  . vitamin C  500 mg Oral Daily  . zinc sulfate  220 mg Oral Daily   Continuous Infusions: . remdesivir 100 mg in NS 250 mL        LOS: 2 days   Charlynne Cousins  [facility]  06/07/2019, 9:24 AM

## 2019-06-08 LAB — COMPREHENSIVE METABOLIC PANEL
ALT: 19 U/L (ref 0–44)
AST: 18 U/L (ref 15–41)
Albumin: 3 g/dL — ABNORMAL LOW (ref 3.5–5.0)
Alkaline Phosphatase: 65 U/L (ref 38–126)
Anion gap: 10 (ref 5–15)
BUN: 33 mg/dL — ABNORMAL HIGH (ref 6–20)
CO2: 24 mmol/L (ref 22–32)
Calcium: 9.2 mg/dL (ref 8.9–10.3)
Chloride: 103 mmol/L (ref 98–111)
Creatinine, Ser: 1.16 mg/dL (ref 0.61–1.24)
GFR calc Af Amer: >60 mL/min (ref >60)
GFR calc non Af Amer: >60 mL/min (ref >60)
Glucose, Bld: 105 mg/dL — ABNORMAL HIGH (ref 70–99)
Potassium: 4.7 mmol/L (ref 3.5–5.1)
Sodium: 137 mmol/L (ref 135–145)
Total Bilirubin: 0.7 mg/dL (ref 0.3–1.2)
Total Protein: 6.7 g/dL (ref 6.5–8.1)

## 2019-06-08 LAB — GLUCOSE, CAPILLARY
Glucose-Capillary: 106 mg/dL — ABNORMAL HIGH (ref 70–99)
Glucose-Capillary: 109 mg/dL — ABNORMAL HIGH (ref 70–99)
Glucose-Capillary: 118 mg/dL — ABNORMAL HIGH (ref 70–99)
Glucose-Capillary: 131 mg/dL — ABNORMAL HIGH (ref 70–99)
Glucose-Capillary: 148 mg/dL — ABNORMAL HIGH (ref 70–99)
Glucose-Capillary: 198 mg/dL — ABNORMAL HIGH (ref 70–99)
Glucose-Capillary: 300 mg/dL — ABNORMAL HIGH (ref 70–99)
Glucose-Capillary: 332 mg/dL — ABNORMAL HIGH (ref 70–99)

## 2019-06-08 LAB — CBC WITH DIFFERENTIAL/PLATELET
Abs Immature Granulocytes: 0.18 10*3/uL — ABNORMAL HIGH (ref 0.00–0.07)
Basophils Absolute: 0 10*3/uL (ref 0.0–0.1)
Basophils Relative: 0 %
Eosinophils Absolute: 0 10*3/uL (ref 0.0–0.5)
Eosinophils Relative: 0 %
HCT: 42.2 % (ref 39.0–52.0)
Hemoglobin: 14 g/dL (ref 13.0–17.0)
Immature Granulocytes: 2 %
Lymphocytes Relative: 9 %
Lymphs Abs: 1 10*3/uL (ref 0.7–4.0)
MCH: 27.9 pg (ref 26.0–34.0)
MCHC: 33.2 g/dL (ref 30.0–36.0)
MCV: 84.1 fL (ref 80.0–100.0)
Monocytes Absolute: 0.8 10*3/uL (ref 0.1–1.0)
Monocytes Relative: 8 %
Neutro Abs: 8.9 10*3/uL — ABNORMAL HIGH (ref 1.7–7.7)
Neutrophils Relative %: 81 %
Platelets: 355 10*3/uL (ref 150–400)
RBC: 5.02 MIL/uL (ref 4.22–5.81)
RDW: 12 % (ref 11.5–15.5)
WBC: 11 10*3/uL — ABNORMAL HIGH (ref 4.0–10.5)
nRBC: 0 % (ref 0.0–0.2)

## 2019-06-08 LAB — C-REACTIVE PROTEIN: CRP: 2.6 mg/dL — ABNORMAL HIGH (ref <1.0)

## 2019-06-08 LAB — MAGNESIUM: Magnesium: 2.3 mg/dL (ref 1.7–2.4)

## 2019-06-08 LAB — HEMOGLOBIN A1C
Hgb A1c MFr Bld: 10.3 % — ABNORMAL HIGH (ref 4.8–5.6)
Mean Plasma Glucose: 249 mg/dL

## 2019-06-08 LAB — D-DIMER, QUANTITATIVE: D-Dimer, Quant: 0.51 ug/mL-FEU — ABNORMAL HIGH (ref 0.00–0.50)

## 2019-06-08 LAB — FERRITIN: Ferritin: 330 ng/mL (ref 24–336)

## 2019-06-08 MED ORDER — INSULIN GLARGINE 100 UNIT/ML ~~LOC~~ SOLN
40.0000 [IU] | Freq: Two times a day (BID) | SUBCUTANEOUS | Status: DC
  Administered 2019-06-08 – 2019-06-10 (×6): 40 [IU] via SUBCUTANEOUS
  Filled 2019-06-08 (×7): qty 0.4

## 2019-06-08 MED ORDER — INSULIN ASPART 100 UNIT/ML ~~LOC~~ SOLN: 0.0000 [IU] | Freq: Three times a day (TID) | SUBCUTANEOUS | Status: DC

## 2019-06-08 MED ORDER — INSULIN ASPART 100 UNIT/ML ~~LOC~~ SOLN: 0.0000 [IU] | Freq: Every day | SUBCUTANEOUS | Status: DC

## 2019-06-08 MED ORDER — INSULIN ASPART 100 UNIT/ML ~~LOC~~ SOLN: 4.0000 [IU] | Freq: Three times a day (TID) | SUBCUTANEOUS | Status: DC

## 2019-06-08 MED ORDER — LISINOPRIL 20 MG PO TABS
40.0000 mg | ORAL_TABLET | Freq: Every day | ORAL | Status: DC
  Administered 2019-06-08 – 2019-06-12 (×5): 40 mg via ORAL
  Filled 2019-06-08 (×5): qty 2

## 2019-06-08 MED ORDER — INSULIN ASPART 100 UNIT/ML ~~LOC~~ SOLN
6.0000 [IU] | Freq: Three times a day (TID) | SUBCUTANEOUS | Status: DC
  Administered 2019-06-08 – 2019-06-10 (×8): 6 [IU] via SUBCUTANEOUS

## 2019-06-08 MED ORDER — INSULIN ASPART 100 UNIT/ML ~~LOC~~ SOLN
0.0000 [IU] | Freq: Three times a day (TID) | SUBCUTANEOUS | Status: DC
  Administered 2019-06-08: 19:00:00 20 [IU] via SUBCUTANEOUS
  Administered 2019-06-08: 13:00:00 15 [IU] via SUBCUTANEOUS
  Administered 2019-06-09: 08:00:00 3 [IU] via SUBCUTANEOUS
  Administered 2019-06-09: 20 [IU] via SUBCUTANEOUS
  Administered 2019-06-09 – 2019-06-10 (×2): 7 [IU] via SUBCUTANEOUS
  Administered 2019-06-10 (×2): 4 [IU] via SUBCUTANEOUS

## 2019-06-08 MED ORDER — INSULIN ASPART 100 UNIT/ML ~~LOC~~ SOLN
0.0000 [IU] | Freq: Every day | SUBCUTANEOUS | Status: DC
  Administered 2019-06-08: 23:00:00 3 [IU] via SUBCUTANEOUS
  Administered 2019-06-09: 22:00:00 4 [IU] via SUBCUTANEOUS
  Administered 2019-06-10: 22:00:00 2 [IU] via SUBCUTANEOUS

## 2019-06-08 NOTE — Plan of Care (Signed)
  Problem: Education: Goal: Knowledge of risk factors and measures for prevention of condition will improve Outcome: Progressing   Problem: Coping: Goal: Psychosocial and spiritual needs will be supported Outcome: Progressing   Problem: Respiratory: Goal: Will maintain a patent airway Outcome: Progressing Goal: Complications related to the disease process, condition or treatment will be avoided or minimized Outcome: Progressing   Problem: Education: Goal: Knowledge of General Education information will improve Description: Including pain rating scale, medication(s)/side effects and non-pharmacologic comfort measures Outcome: Progressing   Problem: Health Behavior/Discharge Planning: Goal: Ability to manage health-related needs will improve Outcome: Progressing   Problem: Clinical Measurements: Goal: Ability to maintain clinical measurements within normal limits will improve Outcome: Progressing Goal: Will remain free from infection Outcome: Progressing Goal: Diagnostic test results will improve Outcome: Progressing Goal: Respiratory complications will improve Outcome: Progressing Goal: Cardiovascular complication will be avoided Outcome: Progressing   Problem: Activity: Goal: Risk for activity intolerance will decrease Outcome: Progressing   Problem: Coping: Goal: Level of anxiety will decrease Outcome: Progressing   Problem: Elimination: Goal: Will not experience complications related to bowel motility Outcome: Progressing Goal: Will not experience complications related to urinary retention Outcome: Progressing   Problem: Pain Managment: Goal: General experience of comfort will improve Outcome: Progressing   Problem: Safety: Goal: Ability to remain free from injury will improve Outcome: Progressing   Problem: Skin Integrity: Goal: Risk for impaired skin integrity will decrease Outcome: Progressing   

## 2019-06-08 NOTE — Progress Notes (Signed)
[facility] PROGRESS NOTE    Progress Note  Earl Griffin  QBH:[id number] DOB: Jan 07, 1961 DOA: 06/05/2019 PCP: Marisue Ivan, MD     Brief Narrative:   Earl Griffin is an 58 y.o. male past medical history significant for atrial fibrillation on Eliquis, essential hypertension diabetes mellitus type 2 presents with [facility] with worsening shortness of breath was found to be hypoxic, he relates his symptoms initially started on 05/31/2019 which progressively got worse in the ED chest x-ray showed bilateral infiltrates.  Assessment/Plan:   Acute respiratory failure with hypoxia (HCC) likely due to COVID-19 viral infection: He still requiring 3 L of oxygen to keep saturation greater than 92%. Continue IV Remdesivir and IV steroids Markers are improving nicely Continue vitamin C and zinc. She is improving and he relates that his breathing is better compared to on admission he is not a candidate for convalescent plasma. Continue to prone patient for at least 16 hours a day. Continue incentive spirometry and flutter valve.  Acute kidney injury on chronic kidney disease stage III/hyperkalemia: With a baseline creatinine of 1.0, likely prerenal azotemia resolved with IV fluid hydration.  Chronic atrial fibrillation on anticoagulation: Rate controlled on diltiazem continue Eliquis.  Diabetes mellitus type 2, uncontrolled with hyperglycemia: Hemoglobin A1c was 10.3, his oral hypoglycemic agents were held on admission he was started on steroids for his COVID-19 eval infection. His blood glucose went above 400, all insulins were DC'd and he was started on an insulin drip and his blood glucose improved quite nicely, will change him to a long-acting insulin at a higher dose and sliding scale resistant scale.  Essential hypertension: His creatinine has returned to baseline, will resume lisinopril continue diltiazem. We will continue to hold hydrochlorothiazide for now continue  hydralazine IV PRN.   Hypovolemic hyponatremia: Resolved with IV fluid hydration.   DVT prophylaxis: lovenox Family Communication:none Disposition Plan/Barrier to D/C: once off oxygen Code Status:     Code Status Orders  (From admission, onward)         Start     Ordered   06/06/19 0104  Full code  Continuous     06/06/19 0108        Code Status History    This patient has a current code status but no historical code status.   Advance Care Planning Activity        IV Access:    Peripheral IV   Procedures and diagnostic studies:   No results found.   Medical Consultants:    None.  Anti-Infectives:   None  Subjective:    Earl Griffin relates his breathing is unchanged compared to yesterday, he relates he still anorexic.  Objective:    Vitals:   06/07/19 1557 06/07/19 1941 06/08/19 0400 06/08/19 0810  BP: (!) 144/92 132/86 (!) 139/95   Pulse: 69 66    Resp: (!) 23 19    Temp: 97.7 F (36.5 C) 97.6 F (36.4 C) 98.2 F (36.8 C) 97.8 F (36.6 C)  TempSrc: Oral Oral Oral   SpO2: 92% 94%    Weight:      Height:       SpO2: 94 % O2 Flow Rate (L/min): 3 L/min   Intake/Output Summary (Last 24 hours) at 06/08/2019 0829 Last data filed at 06/08/2019 0400 Gross per 24 hour  Intake 2732.13 ml  Output 2700 ml  Net 32.13 ml   Filed Weights   06/05/19 2327  Weight: 111.6 kg    Exam: General exam: In no  acute distress. Respiratory system: Good air movement and diffuse crackles bilaterally. Cardiovascular system: S1 & S2 heard, RRR. No JVD. Gastrointestinal system: Abdomen is nondistended, soft and nontender.  Central nervous system: Alert and oriented. No focal neurological deficits. Extremities: No pedal edema. Skin: No rashes, lesions or ulcers Psychiatry: Judgement and insight appear normal. Mood & affect appropriate.    Data Reviewed:    Labs: Basic Metabolic Panel: Recent Labs  Lab 06/05/19 1801 06/06/19 0310  06/07/19  0425 06/07/19 1235 06/08/19 0335  NA 133* 130*  --  136  --  137  K 4.1 5.4*   < > 5.2*  --  4.7  CL 98 98  --  102  --  103  CO2 25 22  --  25  --  24  GLUCOSE 256* 432*  --  347* 431* 105*  BUN 18 25*  --  36*  --  33*  CREATININE 1.37* 1.31*  --  1.18  --  1.16  CALCIUM 8.4* 8.6*  --  9.1  --  9.2  MG  --  2.1  --  2.6*  --  2.3  PHOS  --  4.0  --   --   --   --    < > = values in this interval not displayed.   GFR Estimated Creatinine Clearance: 94.9 mL/min (by C-G formula based on SCr of 1.16 mg/dL). Liver Function Tests: Recent Labs  Lab 06/05/19 1801 06/06/19 0310 06/07/19 0425 06/08/19 0335  AST 20 17 13* 18  ALT 16 17 16 19   ALKPHOS 67 64 68 65  BILITOT 1.0 1.1 0.8 0.7  PROT 7.1 7.1 7.2 6.7  ALBUMIN 3.2* 3.1* 3.0* 3.0*   No results for input(s): LIPASE, AMYLASE in the last 168 hours. No results for input(s): AMMONIA in the last 168 hours. Coagulation profile No results for input(s): INR, PROTIME in the last 168 hours. COVID-19 Labs  Recent Labs    06/06/19 0310 06/07/19 0425 06/08/19 0335  DDIMER 1.12* 0.72* 0.51*  FERRITIN 323 371* 330  CRP 13.4* 7.1* 2.6*    Lab Results  Component Value Date   SARSCOV2NAA DETECTED (A) 05/31/2019    CBC: Recent Labs  Lab 06/05/19 1801 06/06/19 0310 06/07/19 0425 06/08/19 0335  WBC 6.5 6.3 9.8 11.0*  NEUTROABS 5.3 5.6 8.1* 8.9*  HGB 14.9 14.2 14.3 14.0  HCT 43.9 43.5 42.4 42.2  MCV 82.5 85.1 83.8 84.1  PLT 226 231 303 355   Cardiac Enzymes: No results for input(s): CKTOTAL, CKMB, CKMBINDEX, TROPONINI in the last 168 hours. BNP (last 3 results) No results for input(s): PROBNP in the last 8760 hours. CBG: Recent Labs  Lab 06/08/19 0104 06/08/19 0158 06/08/19 0247 06/08/19 0336 06/08/19 0808  GLUCAP 131* 118* 109* 106* 148*   D-Dimer: Recent Labs    06/07/19 0425 06/08/19 0335  DDIMER 0.72* 0.51*   Hgb A1c: Recent Labs    06/06/19 0310 06/07/19 0425  HGBA1C 10.0* 10.3*   Lipid  Profile: No results for input(s): CHOL, HDL, LDLCALC, TRIG, CHOLHDL, LDLDIRECT in the last 72 hours. Thyroid function studies: No results for input(s): TSH, T4TOTAL, T3FREE, THYROIDAB in the last 72 hours.  Invalid input(s): FREET3 Anemia work up: Recent Labs    06/07/19 0425 06/08/19 0335  FERRITIN 371* 330   Sepsis Labs: Recent Labs  Lab 06/05/19 1801 06/06/19 0310 06/07/19 0425 06/08/19 0335  PROCALCITON <0.10  --   --   --   WBC 6.5 6.3 9.8  11.0*   Microbiology Recent Results (from the past 240 hour(s))  Novel Coronavirus, NAA (Hosp order, Send-out to Ref Lab; TAT 18-24 hrs     Status: Abnormal   Collection Time: 05/31/19  8:58 PM   Specimen: Nasopharyngeal Swab; Respiratory  Result Value Ref Range Status   SARS-CoV-2, NAA DETECTED (A) NOT DETECTED Final    Comment: (NOTE)                  Client Requested Flag This nucleic acid amplification test was developed and its performance characteristics determined by [employer]. Nucleic acid amplification tests include PCR and TMA. This test has not been FDA cleared or approved. This test has been authorized by FDA under an Emergency Use Authorization (EUA). This test is only authorized for the duration of time the declaration that circumstances exist justifying the authorization of the emergency use of in vitro diagnostic tests for detection of SARS-CoV-2 virus and/or diagnosis of COVID-19 infection under section 564(b)(1) of the Act, 21 U.S.C. 716RCV-8(L) (1), unless the authorization is terminated or revoked sooner. When diagnostic testing is negative, the possibility of a false negative result should be considered in the context of a patient's recent exposures and the presence of clinical signs and symptoms consistent with COVID-19. An individual without symptoms of COVID-  19 and who is not shedding SARS-CoV-2 virus would expect to have a negative (not detected) result in this assay. Performed At: [facility] [place], [place] [id number] Rush Farmer MD CH:[id number]    [place]  Final    Comment: Performed at [facility], [address] [place], [place] [address]     Medications:   . albuterol  2 puff Inhalation Q6H  . apixaban  5 mg Oral BID  . dexamethasone (DECADRON) injection  6 mg Intravenous Q24H  . dextromethorphan-guaiFENesin  1 tablet Oral BID  . diltiazem  120 mg Oral Daily  . famotidine  20 mg Oral Daily  . insulin aspart  0-15 Units Subcutaneous TID WC  . insulin aspart  0-5 Units Subcutaneous QHS  . insulin aspart  4 Units Subcutaneous TID WC  . insulin glargine  25 Units Subcutaneous BID  . polyethylene glycol  17 g Oral BID  . Ensure Max Protein  11 oz Oral BID  . vitamin C  500 mg Oral Daily  . zinc sulfate  220 mg Oral Daily   Continuous Infusions: . sodium chloride 10 mL/hr at 06/07/19 2154  . remdesivir 100 mg in NS 250 mL Stopped (06/07/19 1001)      LOS: 3 days   Charlynne Cousins  [facility]  06/08/2019, 8:29 AM

## 2019-06-08 NOTE — Progress Notes (Signed)
Per order, MD text paged that 2+ CBG readings below 200 for decision re: stop insulin drip and start SSI.  MD R. Shanon Brow returned page and instructed RN to stop the drip, patient to resume SSI and ACHS.

## 2019-06-09 LAB — CBC WITH DIFFERENTIAL/PLATELET
Abs Immature Granulocytes: 0.26 10*3/uL — ABNORMAL HIGH (ref 0.00–0.07)
Basophils Absolute: 0.1 10*3/uL (ref 0.0–0.1)
Basophils Relative: 1 %
Eosinophils Absolute: 0 10*3/uL (ref 0.0–0.5)
Eosinophils Relative: 0 %
HCT: 43.6 % (ref 39.0–52.0)
Hemoglobin: 14.6 g/dL (ref 13.0–17.0)
Immature Granulocytes: 2 %
Lymphocytes Relative: 11 %
Lymphs Abs: 1.3 10*3/uL (ref 0.7–4.0)
MCH: 27.8 pg (ref 26.0–34.0)
MCHC: 33.5 g/dL (ref 30.0–36.0)
MCV: 83 fL (ref 80.0–100.0)
Monocytes Absolute: 1 10*3/uL (ref 0.1–1.0)
Monocytes Relative: 8 %
Neutro Abs: 8.8 10*3/uL — ABNORMAL HIGH (ref 1.7–7.7)
Neutrophils Relative %: 78 %
Platelets: 393 10*3/uL (ref 150–400)
RBC: 5.25 MIL/uL (ref 4.22–5.81)
RDW: 12.1 % (ref 11.5–15.5)
WBC: 11.4 10*3/uL — ABNORMAL HIGH (ref 4.0–10.5)
nRBC: 0 % (ref 0.0–0.2)

## 2019-06-09 LAB — COMPREHENSIVE METABOLIC PANEL
ALT: 25 U/L (ref 0–44)
AST: 22 U/L (ref 15–41)
Albumin: 2.9 g/dL — ABNORMAL LOW (ref 3.5–5.0)
Alkaline Phosphatase: 63 U/L (ref 38–126)
Anion gap: 9 (ref 5–15)
BUN: 31 mg/dL — ABNORMAL HIGH (ref 6–20)
CO2: 24 mmol/L (ref 22–32)
Calcium: 9.2 mg/dL (ref 8.9–10.3)
Chloride: 102 mmol/L (ref 98–111)
Creatinine, Ser: 1.18 mg/dL (ref 0.61–1.24)
GFR calc Af Amer: >60 mL/min (ref >60)
GFR calc non Af Amer: >60 mL/min (ref >60)
Glucose, Bld: 175 mg/dL — ABNORMAL HIGH (ref 70–99)
Potassium: 4.7 mmol/L (ref 3.5–5.1)
Sodium: 135 mmol/L (ref 135–145)
Total Bilirubin: 0.6 mg/dL (ref 0.3–1.2)
Total Protein: 6.5 g/dL (ref 6.5–8.1)

## 2019-06-09 LAB — GLUCOSE, CAPILLARY
Glucose-Capillary: 387 mg/dL — ABNORMAL HIGH (ref 70–99)
Glucose-Capillary: 141 mg/dL — ABNORMAL HIGH (ref 70–99)
Glucose-Capillary: 207 mg/dL — ABNORMAL HIGH (ref 70–99)
Glucose-Capillary: 351 mg/dL — ABNORMAL HIGH (ref 70–99)
Glucose-Capillary: 340 mg/dL — ABNORMAL HIGH (ref 70–99)

## 2019-06-09 LAB — MAGNESIUM: Magnesium: 2.1 mg/dL (ref 1.7–2.4)

## 2019-06-09 LAB — D-DIMER, QUANTITATIVE: D-Dimer, Quant: 0.53 ug/mL-FEU — ABNORMAL HIGH (ref 0.00–0.50)

## 2019-06-09 LAB — FERRITIN: Ferritin: 312 ng/mL (ref 24–336)

## 2019-06-09 LAB — C-REACTIVE PROTEIN: CRP: 1.2 mg/dL — ABNORMAL HIGH (ref <1.0)

## 2019-06-09 MED ORDER — DEXAMETHASONE SODIUM PHOSPHATE 4 MG/ML IJ SOLN
4.0000 mg | INTRAMUSCULAR | Status: DC
  Administered 2019-06-09: 4 mg via INTRAVENOUS
  Filled 2019-06-09 (×2): qty 1

## 2019-06-09 NOTE — Progress Notes (Signed)
Sharyn Lull updated

## 2019-06-09 NOTE — Progress Notes (Signed)
Patient doing well on room air, maintaining sats anywhere from 91-96%, no resp distress noted, in good spirits. Offered to call and update primary contact person but patient states that she Sharyn Lull) has been calling him all throughout the day and it was not necessary for [place] to call her at this time. He was very thankful for the offer. Will continue to monitor.

## 2019-06-09 NOTE — Progress Notes (Addendum)
Inpatient Diabetes Program Recommendations  AACE/ADA: New Consensus Statement on Inpatient Glycemic Control (2015)  Target Ranges:  Prepandial:   less than 140 mg/dL      Peak postprandial:   less than 180 mg/dL (1-2 hours)      Critically ill patients:  140 - 180 mg/dL   Lab Results  Component Value Date   GLUCAP 141 (H) 06/09/2019   HGBA1C 10.3 (H) 06/07/2019    Review of Glycemic Control Results for NILSON, TABORA (MRN [id number]) as of 06/09/2019 10:47  Ref. Range 06/08/2019 11:19 06/08/2019 16:05 06/08/2019 21:00 06/09/2019 07:39  Glucose-Capillary Latest Ref Range: 70 - 99 mg/dL 332 (H) 387 (H) 300 (H) 141 (H)    Diabetes history:Type 2 DM Outpatient Diabetes medications:Metformin 1000 mg BID, Amaryl 4 mg BID Current orders for Inpatient glycemic control:Novolog 0-20 units TID, novolog 0-5 units QHS, Lantus 40 units BID, Novolog 6 units TID Decadron 2 mg QD  Inpatient Diabetes Program Recommendations:  Consider increasing Novolog 12 units TID (assuming patient is consuming >50% of meal).  Addendum@1500 : Spoke with patient regarding outpatient DM management.  Reviewed patient's current A1c of 10.3%. Explained what a A1c is and what it measures. Also reviewed goal A1c with patient, importance of good glucose control @ home, and blood sugar goals. Reviewed patho of DM, possibility of insulin at discharge, impact on glucose with infection and steroids, reviewed survival skills, hypo vs hyper glycemia, interventions, vascular changes and commorbidities.  Patient has a meter and reports checking 2 times per day. Denies drinking large amount of sugary beverages.  Discussed the possibility of needing insulin at discharge given steroids. Patient is accepting and reports he used to give injections to his mother in the form of vial and syringe and feels most comfortable with this method. In the event of insulin at discharge, patient prefers vial and syringe. Encouraged patient to attempt  self injection with insulin while inpatient. Patient plans to follow up with PCP. Has no further questions at this time.   Thanks, Bronson Curb, MSN, RNC-OB Diabetes Coordinator [phone] (8a-5p)

## 2019-06-09 NOTE — Progress Notes (Signed)
Spoke with Sharyn Lull and updated on patients condition.  All questions and concerns answered.

## 2019-06-09 NOTE — Progress Notes (Signed)
[facility] PROGRESS NOTE    Progress Note  Earl Griffin  QIH:[id number] DOB: [birth year] DOA: 06/05/2019 PCP: Marisue Ivan, MD     Brief Narrative:   Earl Griffin is an 58 y.o. male past medical history significant for atrial fibrillation on Eliquis, essential hypertension diabetes mellitus type 2 presents with [facility] ED with worsening shortness of breath was found to be hypoxic, he relates his symptoms initially started on 05/31/2019 which progressively got worse in the ED chest x-ray showed bilateral infiltrates.  Assessment/Plan:   Acute respiratory failure with hypoxia (HCC) likely due to COVID-19 viral infection: He is currently satting greater 91% on room air, will continue IV Remdesivir and steroids for a total of 5 days. His inflammatory markers are significantly improved, will try to keep the patient prone for at least 16 hours a day. Continue incentive spirometry and flutter valve.  Acute kidney injury on chronic kidney disease stage III/hyperkalemia: With a baseline creatinine of 0.1 on admission elevated likely prerenal azotemia resolved with IV fluid hydration.  Chronic atrial fibrillation on anticoagulation: Controlled on diltiazem continue Eliquis.  Diabetes mellitus type 2, uncontrolled with hyperglycemia: Hemoglobin A1c of 10.3, oral hypoglycemic agents were held on admission, his blood glucose has been erratic likely due to steroids. As his blood glucose was greater than 400 and one point he had to be placed on insulin drip, he is now been transitioned to long-acting insulin plus sliding scale and will coming down on steroids.  Essential hypertension: Pressures fairly controlled, continue lisinopril and diltiazem. Use hydralazine IV as needed for systolic blood pressure greater than 160.  Hypovolemic hyponatremia: Resolved with IV fluid hydration.   DVT prophylaxis: lovenox Family Communication:none Disposition Plan/Barrier to D/C: once off  oxygen Code Status:     Code Status Orders  (From admission, onward)         Start     Ordered   06/06/19 0104  Full code  Continuous     06/06/19 0108        Code Status History    This patient has a current code status but no historical code status.   Advance Care Planning Activity        IV Access:    Peripheral IV   Procedures and diagnostic studies:   No results found.   Medical Consultants:    None.  Anti-Infectives:   None  Subjective:    Earl Griffin  relates he is breathing is better.  Objective:    Vitals:   06/09/19 0200 06/09/19 0300 06/09/19 0400 06/09/19 0802  BP:  126/83  (!) 147/93  Pulse: (!) 58 (!) 58 61 60  Resp: 14 15 20 16   Temp:  98.1 F (36.7 C)  98 F (36.7 C)  TempSrc:    Oral  SpO2: 93% 90% 91% 92%  Weight:      Height:       SpO2: 92 % O2 Flow Rate (L/min): 3 L/min   Intake/Output Summary (Last 24 hours) at 06/09/2019 0821 Last data filed at 06/09/2019 0300 Gross per 24 hour  Intake 2530 ml  Output 2875 ml  Net -345 ml   Filed Weights   06/05/19 2327  Weight: 111.6 kg    Exam: General exam: In no acute distress. Respiratory system: Good air movement and diffuse crackles bilaterally. Cardiovascular system: S1 & S2 heard, RRR. No JVD. Gastrointestinal system: Abdomen is nondistended, soft and nontender.  Central nervous system: Alert and oriented. No focal neurological deficits. Extremities: No pedal  edema. Skin: No rashes, lesions or ulcers Psychiatry: Judgement and insight appear normal. Mood & affect appropriate.    Data Reviewed:    Labs: Basic Metabolic Panel: Recent Labs  Lab 06/05/19 1801 06/06/19 0310  06/07/19 0425 06/07/19 1235 06/08/19 0335 06/09/19 0412  NA 133* 130*  --  136  --  137 135  K 4.1 5.4*   < > 5.2*  --  4.7 4.7  CL 98 98  --  102  --  103 102  CO2 25 22  --  25  --  24 24  GLUCOSE 256* 432*  --  347* 431* 105* 175*  BUN 18 25*  --  36*  --  33* 31*  CREATININE  1.37* 1.31*  --  1.18  --  1.16 1.18  CALCIUM 8.4* 8.6*  --  9.1  --  9.2 9.2  MG  --  2.1  --  2.6*  --  2.3 2.1  PHOS  --  4.0  --   --   --   --   --    < > = values in this interval not displayed.   GFR Estimated Creatinine Clearance: 93.3 mL/min (by C-G formula based on SCr of 1.18 mg/dL). Liver Function Tests: Recent Labs  Lab 06/05/19 1801 06/06/19 0310 06/07/19 0425 06/08/19 0335 06/09/19 0412  AST 20 17 13* 18 22  ALT 16 17 16 19 25   ALKPHOS 67 64 68 65 63  BILITOT 1.0 1.1 0.8 0.7 0.6  PROT 7.1 7.1 7.2 6.7 6.5  ALBUMIN 3.2* 3.1* 3.0* 3.0* 2.9*   No results for input(s): LIPASE, AMYLASE in the last 168 hours. No results for input(s): AMMONIA in the last 168 hours. Coagulation profile No results for input(s): INR, PROTIME in the last 168 hours. COVID-19 Labs  Recent Labs    06/07/19 0425 06/08/19 0335 06/09/19 0412  DDIMER 0.72* 0.51* 0.53*  FERRITIN 371* 330 312  CRP 7.1* 2.6* 1.2*    Lab Results  Component Value Date   SARSCOV2NAA DETECTED (A) 05/31/2019    CBC: Recent Labs  Lab 06/05/19 1801 06/06/19 0310 06/07/19 0425 06/08/19 0335 06/09/19 0412  WBC 6.5 6.3 9.8 11.0* 11.4*  NEUTROABS 5.3 5.6 8.1* 8.9* 8.8*  HGB 14.9 14.2 14.3 14.0 14.6  HCT 43.9 43.5 42.4 42.2 43.6  MCV 82.5 85.1 83.8 84.1 83.0  PLT 226 231 303 355 393   Cardiac Enzymes: No results for input(s): CKTOTAL, CKMB, CKMBINDEX, TROPONINI in the last 168 hours. BNP (last 3 results) No results for input(s): PROBNP in the last 8760 hours. CBG: Recent Labs  Lab 06/08/19 0808 06/08/19 1119 06/08/19 1605 06/08/19 2100 06/09/19 0739  GLUCAP 148* 332* 387* 300* 141*   D-Dimer: Recent Labs    06/08/19 0335 06/09/19 0412  DDIMER 0.51* 0.53*   Hgb A1c: Recent Labs    06/07/19 0425  HGBA1C 10.3*   Lipid Profile: No results for input(s): CHOL, HDL, LDLCALC, TRIG, CHOLHDL, LDLDIRECT in the last 72 hours. Thyroid function studies: No results for input(s): TSH, T4TOTAL,  T3FREE, THYROIDAB in the last 72 hours.  Invalid input(s): FREET3 Anemia work up: Recent Labs    06/08/19 0335 06/09/19 0412  FERRITIN 330 312   Sepsis Labs: Recent Labs  Lab 06/05/19 1801 06/06/19 0310 06/07/19 0425 06/08/19 0335 06/09/19 0412  PROCALCITON <0.10  --   --   --   --   WBC 6.5 6.3 9.8 11.0* 11.4*   Microbiology Recent Results (from the past  240 hour(s))  Novel Coronavirus, NAA (Hosp order, Send-out to Ref Lab; TAT 18-24 hrs     Status: Abnormal   Collection Time: 05/31/19  8:58 PM   Specimen: Nasopharyngeal Swab; Respiratory  Result Value Ref Range Status   SARS-CoV-2, NAA DETECTED (A) NOT DETECTED Final    Comment: (NOTE)                  Client Requested Flag This nucleic acid amplification test was developed and its performance characteristics determined by [facility]. Nucleic acid amplification tests include PCR and TMA. This test has not been FDA cleared or approved. This test has been authorized by FDA under an Emergency Use Authorization (EUA). This test is only authorized for the duration of time the declaration that circumstances exist justifying the authorization of the emergency use of in vitro diagnostic tests for detection of SARS-CoV-2 virus and/or diagnosis of COVID-19 infection under section 564(b)(1) of the Act, 21 U.S.C. 829FAO-1(H360bbb-3(b) (1), unless the authorization is terminated or revoked sooner. When diagnostic testing is negative, the possibility of a false negative result should be considered in the context of a patient's recent exposures and the presence of clinical signs and symptoms consistent with COVID-19. An individual without symptoms of COVID-  19 and who is not shedding SARS-CoV-2 virus would expect to have a negative (not detected) result in this assay. Performed At: [facility] [address] [place], [place] [id number] Jolene SchimkeNagendra Sanjai MD GE:[id number]:[phone]    Coronavirus Source NASOPHARYNGEAL  Final     Comment: Performed at [facility], [address] [place], [place] [address]     Medications:   . albuterol  2 puff Inhalation Q6H  . apixaban  5 mg Oral BID  . dexamethasone (DECADRON) injection  6 mg Intravenous Q24H  . dextromethorphan-guaiFENesin  1 tablet Oral BID  . diltiazem  120 mg Oral Daily  . famotidine  20 mg Oral Daily  . insulin aspart  0-20 Units Subcutaneous TID WC  . insulin aspart  0-5 Units Subcutaneous QHS  . insulin aspart  6 Units Subcutaneous TID WC  . insulin glargine  40 Units Subcutaneous BID  . lisinopril  40 mg Oral Daily  . polyethylene glycol  17 g Oral BID  . Ensure Max Protein  11 oz Oral BID  . vitamin C  500 mg Oral Daily  . zinc sulfate  220 mg Oral Daily   Continuous Infusions: . sodium chloride 10 mL/hr at 06/07/19 2154  . remdesivir 100 mg in NS 250 mL 100 mg (06/08/19 0845)      LOS: 4 days   Marinda ElkAbraham Feliz Ortiz  [facility]  06/09/2019, 8:21 AM

## 2019-06-10 LAB — COMPREHENSIVE METABOLIC PANEL
ALT: 30 U/L (ref 0–44)
AST: 16 U/L (ref 15–41)
Albumin: 3.1 g/dL — ABNORMAL LOW (ref 3.5–5.0)
Alkaline Phosphatase: 70 U/L (ref 38–126)
Anion gap: 9 (ref 5–15)
BUN: 33 mg/dL — ABNORMAL HIGH (ref 6–20)
CO2: 25 mmol/L (ref 22–32)
Calcium: 9.5 mg/dL (ref 8.9–10.3)
Chloride: 102 mmol/L (ref 98–111)
Creatinine, Ser: 1.13 mg/dL (ref 0.61–1.24)
GFR calc Af Amer: >60 mL/min (ref >60)
GFR calc non Af Amer: >60 mL/min (ref >60)
Glucose, Bld: 238 mg/dL — ABNORMAL HIGH (ref 70–99)
Potassium: 5 mmol/L (ref 3.5–5.1)
Sodium: 136 mmol/L (ref 135–145)
Total Bilirubin: 0.8 mg/dL (ref 0.3–1.2)
Total Protein: 6.8 g/dL (ref 6.5–8.1)

## 2019-06-10 LAB — CBC WITH DIFFERENTIAL/PLATELET
Abs Immature Granulocytes: 0.78 10*3/uL — ABNORMAL HIGH (ref 0.00–0.07)
Basophils Absolute: 0.1 10*3/uL (ref 0.0–0.1)
Basophils Relative: 1 %
Eosinophils Absolute: 0 10*3/uL (ref 0.0–0.5)
Eosinophils Relative: 0 %
HCT: 45 % (ref 39.0–52.0)
Hemoglobin: 15.1 g/dL (ref 13.0–17.0)
Immature Granulocytes: 7 %
Lymphocytes Relative: 12 %
Lymphs Abs: 1.4 10*3/uL (ref 0.7–4.0)
MCH: 28.2 pg (ref 26.0–34.0)
MCHC: 33.6 g/dL (ref 30.0–36.0)
MCV: 84 fL (ref 80.0–100.0)
Monocytes Absolute: 1 10*3/uL (ref 0.1–1.0)
Monocytes Relative: 8 %
Neutro Abs: 8.7 10*3/uL — ABNORMAL HIGH (ref 1.7–7.7)
Neutrophils Relative %: 72 %
Platelets: 445 10*3/uL — ABNORMAL HIGH (ref 150–400)
RBC: 5.36 MIL/uL (ref 4.22–5.81)
RDW: 12.2 % (ref 11.5–15.5)
WBC: 12 10*3/uL — ABNORMAL HIGH (ref 4.0–10.5)
nRBC: 0.3 % — ABNORMAL HIGH (ref 0.0–0.2)

## 2019-06-10 LAB — C-REACTIVE PROTEIN: CRP: 0.8 mg/dL (ref <1.0)

## 2019-06-10 LAB — GLUCOSE, CAPILLARY
Glucose-Capillary: 180 mg/dL — ABNORMAL HIGH (ref 70–99)
Glucose-Capillary: 183 mg/dL — ABNORMAL HIGH (ref 70–99)
Glucose-Capillary: 227 mg/dL — ABNORMAL HIGH (ref 70–99)
Glucose-Capillary: 204 mg/dL — ABNORMAL HIGH (ref 70–99)

## 2019-06-10 LAB — MAGNESIUM: Magnesium: 2.2 mg/dL (ref 1.7–2.4)

## 2019-06-10 LAB — FERRITIN: Ferritin: 297 ng/mL (ref 24–336)

## 2019-06-10 LAB — D-DIMER, QUANTITATIVE: D-Dimer, Quant: 0.57 ug/mL-FEU — ABNORMAL HIGH (ref 0.00–0.50)

## 2019-06-10 MED ORDER — GLIMEPIRIDE 4 MG PO TABS
4.0000 mg | ORAL_TABLET | Freq: Two times a day (BID) | ORAL | Status: DC
  Administered 2019-06-10 – 2019-06-12 (×5): 4 mg via ORAL
  Filled 2019-06-10 (×8): qty 1

## 2019-06-10 MED ORDER — METFORMIN HCL 500 MG PO TABS
1000.0000 mg | ORAL_TABLET | Freq: Two times a day (BID) | ORAL | Status: DC
  Administered 2019-06-10 – 2019-06-12 (×5): 1000 mg via ORAL
  Filled 2019-06-10 (×5): qty 2

## 2019-06-10 MED ORDER — DEXAMETHASONE SODIUM PHOSPHATE 4 MG/ML IJ SOLN
2.0000 mg | INTRAMUSCULAR | Status: AC
  Administered 2019-06-10: 11:00:00 2 mg via INTRAVENOUS

## 2019-06-10 MED ORDER — SODIUM POLYSTYRENE SULFONATE 15 GM/60ML PO SUSP
15.0000 g | Freq: Once | ORAL | Status: AC
  Administered 2019-06-10: 11:00:00 15 g via ORAL
  Filled 2019-06-10: qty 60

## 2019-06-10 NOTE — Progress Notes (Signed)
Patient on the phone with Sharyn Lull when nurse came in the room.  Asked patient if he wanted the nurse to call and update Sharyn Lull, he stated no he was just talking to her and updates her on a daily basis.

## 2019-06-10 NOTE — Progress Notes (Signed)
[facility] PROGRESS NOTE    Progress Note  [name]  Earl Griffin:[id number] DOB: 28-Dec-1960 DOA: 06/05/2019 PCP: Dion Body, MD     Brief Narrative:   Earl Griffin is an 58 y.o. male past medical history significant for atrial fibrillation on Eliquis, essential hypertension diabetes mellitus type 2 presents with [place] ED with worsening shortness of breath was found to be hypoxic, he relates his symptoms initially started on 05/31/2019 which progressively got worse in the ED chest x-ray showed bilateral infiltrates.  Assessment/Plan:   Acute respiratory failure with hypoxia (HCC) likely due to COVID-19 viral infection: He satting greater than 90% on room air, he will complete his course of IV Remdesivir and steroids today. We will ambulate the patient and check saturations.  His inflammatory markers are significantly improved. Check keep the patient peripherally 16 hours a day, continuous into spirometry and flutter valve.  Acute kidney injury on chronic kidney disease stage III/hyperkalemia: With a baseline creatinine of less than 1, elevated on admission likely prerenal azotemia resolved with IV fluid hydration.  Chronic atrial fibrillation on anticoagulation: Controlled on diltiazem continue Eliquis.  Diabetes mellitus type 2, uncontrolled with hyperglycemia: Hemoglobin A1c of 10.3, oral hypoglycemic agents were held at admission his blood glucose continues to be erratic. We will go ahead and start him on his metformin glipizide, continue long-acting insulin plus sliding scale. This erratic behavior his blood glucose is likely due to dexamethasone which she will complete treatment today.  Essential hypertension: Pressures fairly controlled, continue lisinopril and diltiazem. Use hydralazine IV as needed for systolic blood pressure greater than 160.  Hypovolemic hyponatremia: Resolved with IV fluid hydration.   DVT prophylaxis: lovenox Family Communication:none  Disposition Plan/Barrier to D/C: once off oxygen Code Status:     Code Status Orders  (From admission, onward)         Start     Ordered   06/06/19 0104  Full code  Continuous     06/06/19 0108        Code Status History    This patient has a current code status but no historical code status.   Advance Care Planning Activity        IV Access:    Peripheral IV   Procedures and diagnostic studies:   No results found.   Medical Consultants:    None.  Anti-Infectives:   None  Subjective:    Lou Miner no complaints today.  Objective:    Vitals:   06/09/19 2043 06/09/19 2339 06/10/19 0438 06/10/19 0745  BP: (!) 141/98 (!) 138/93  124/88  Pulse: (!) 56   (!) 59  Resp:  18 17 17   Temp: 98.2 F (36.8 C) 98.3 F (36.8 C) 98.2 F (36.8 C) 97.8 F (36.6 C)  TempSrc: Oral Oral Oral Oral  SpO2: 92%   92%  Weight:      Height:       SpO2: 92 % O2 Flow Rate (L/min): 3 L/min   Intake/Output Summary (Last 24 hours) at 06/10/2019 0851 Last data filed at 06/10/2019 0745 Gross per 24 hour  Intake 2190.56 ml  Output 3750 ml  Net -1559.44 ml   Filed Weights   06/05/19 2327  Weight: 111.6 kg    Exam: General exam: In no acute distress. Respiratory system: Good air movement and diffuse crackles bilaterally. Cardiovascular system: S1 & S2 heard, RRR. No JVD. Gastrointestinal system: Abdomen is nondistended, soft and nontender.  Central nervous system: Alert and oriented. No focal neurological deficits. Extremities:  No pedal edema. Skin: No rashes, lesions or ulcers Psychiatry: Judgement and insight appear normal. Mood & affect appropriate.    Data Reviewed:    Labs: Basic Metabolic Panel: Recent Labs  Lab 06/06/19 0310  06/07/19 0425 06/07/19 1235 06/08/19 0335 06/09/19 0412 06/10/19 0337  NA 130*  --  136  --  137 135 136  K 5.4*   < > 5.2*  --  4.7 4.7 5.0  CL 98  --  102  --  103 102 102  CO2 22  --  25  --  24 24 25   GLUCOSE  432*  --  347* 431* 105* 175* 238*  BUN 25*  --  36*  --  33* 31* 33*  CREATININE 1.31*  --  1.18  --  1.16 1.18 1.13  CALCIUM 8.6*  --  9.1  --  9.2 9.2 9.5  MG 2.1  --  2.6*  --  2.3 2.1 2.2  PHOS 4.0  --   --   --   --   --   --    < > = values in this interval not displayed.   GFR Estimated Creatinine Clearance: 97.5 mL/min (by C-G formula based on SCr of 1.13 mg/dL). Liver Function Tests: Recent Labs  Lab 06/06/19 0310 06/07/19 0425 06/08/19 0335 06/09/19 0412 06/10/19 0337  AST 17 13* 18 22 16   ALT 17 16 19 25 30   ALKPHOS 64 68 65 63 70  BILITOT 1.1 0.8 0.7 0.6 0.8  PROT 7.1 7.2 6.7 6.5 6.8  ALBUMIN 3.1* 3.0* 3.0* 2.9* 3.1*   No results for input(s): LIPASE, AMYLASE in the last 168 hours. No results for input(s): AMMONIA in the last 168 hours. Coagulation profile No results for input(s): INR, PROTIME in the last 168 hours. COVID-19 Labs  Recent Labs    06/08/19 0335 06/09/19 0412 06/10/19 0337  DDIMER 0.51* 0.53* 0.57*  FERRITIN 330 312 297  CRP 2.6* 1.2* 0.8    Lab Results  Component Value Date   SARSCOV2NAA DETECTED (A) 05/31/2019    CBC: Recent Labs  Lab 06/06/19 0310 06/07/19 0425 06/08/19 0335 06/09/19 0412 06/10/19 0337  WBC 6.3 9.8 11.0* 11.4* 12.0*  NEUTROABS 5.6 8.1* 8.9* 8.8* 8.7*  HGB 14.2 14.3 14.0 14.6 15.1  HCT 43.5 42.4 42.2 43.6 45.0  MCV 85.1 83.8 84.1 83.0 84.0  PLT 231 303 355 393 445*   Cardiac Enzymes: No results for input(s): CKTOTAL, CKMB, CKMBINDEX, TROPONINI in the last 168 hours. BNP (last 3 results) No results for input(s): PROBNP in the last 8760 hours. CBG: Recent Labs  Lab 06/09/19 0739 06/09/19 1122 06/09/19 1705 06/09/19 2109 06/10/19 0744  GLUCAP 141* 207* 351* 340* 180*   D-Dimer: Recent Labs    06/09/19 0412 06/10/19 0337  DDIMER 0.53* 0.57*   Hgb A1c: No results for input(s): HGBA1C in the last 72 hours. Lipid Profile: No results for input(s): CHOL, HDL, LDLCALC, TRIG, CHOLHDL, LDLDIRECT in  the last 72 hours. Thyroid function studies: No results for input(s): TSH, T4TOTAL, T3FREE, THYROIDAB in the last 72 hours.  Invalid input(s): FREET3 Anemia work up: Recent Labs    06/09/19 0412 06/10/19 0337  FERRITIN 312 297   Sepsis Labs: Recent Labs  Lab 06/05/19 1801  06/07/19 0425 06/08/19 0335 06/09/19 0412 06/10/19 0337  PROCALCITON <0.10  --   --   --   --   --   WBC 6.5   < > 9.8 11.0* 11.4* 12.0*   < > =  values in this interval not displayed.   Microbiology Recent Results (from the past 240 hour(s))  Novel Coronavirus, NAA (Hosp order, Send-out to Ref Lab; TAT 18-24 hrs     Status: Abnormal   Collection Time: 05/31/19  8:58 PM   Specimen: Nasopharyngeal Swab; Respiratory  Result Value Ref Range Status   SARS-CoV-2, NAA DETECTED (A) NOT DETECTED Final    Comment: (NOTE)                  Client Requested Flag This nucleic acid amplification test was developed and its performance characteristics determined by [employer]. Nucleic acid amplification tests include PCR and TMA. This test has not been FDA cleared or approved. This test has been authorized by FDA under an Emergency Use Authorization (EUA). This test is only authorized for the duration of time the declaration that circumstances exist justifying the authorization of the emergency use of in vitro diagnostic tests for detection of SARS-CoV-2 virus and/or diagnosis of COVID-19 infection under section 564(b)(1) of the Act, 21 U.S.C. 956LOV-5(I) (1), unless the authorization is terminated or revoked sooner. When diagnostic testing is negative, the possibility of a false negative result should be considered in the context of a patient's recent exposures and the presence of clinical signs and symptoms consistent with COVID-19. An individual without symptoms of COVID-  19 and who is not shedding SARS-CoV-2 virus would expect to have a negative (not detected) result in this assay. Performed At: [facility] [address] [place], [place] [id number] Jolene Schimke MD CZ:[id number]    Coronavirus Source NASOPHARYNGEAL  Final    Comment: Performed at [facility], [address] [place], [place] [address]     Medications:   . albuterol  2 puff Inhalation Q6H  . apixaban  5 mg Oral BID  . dexamethasone (DECADRON) injection  4 mg Intravenous Q24H  . dextromethorphan-guaiFENesin  1 tablet Oral BID  . diltiazem  120 mg Oral Daily  . famotidine  20 mg Oral Daily  . insulin aspart  0-20 Units Subcutaneous TID WC  . insulin aspart  0-5 Units Subcutaneous QHS  . insulin aspart  6 Units Subcutaneous TID WC  . insulin glargine  40 Units Subcutaneous BID  . lisinopril  40 mg Oral Daily  . Ensure Max Protein  11 oz Oral BID  . vitamin C  500 mg Oral Daily  . zinc sulfate  220 mg Oral Daily   Continuous Infusions: . sodium chloride 10 mL/hr at 06/07/19 2154  . remdesivir 100 mg in NS 250 mL Stopped (06/09/19 0953)      LOS: 5 days   Marinda Elk  [facility]  06/10/2019, 8:51 AM

## 2019-06-10 NOTE — Progress Notes (Signed)
Michelle updated 

## 2019-06-11 LAB — BASIC METABOLIC PANEL
Anion gap: 8 (ref 5–15)
BUN: 33 mg/dL — ABNORMAL HIGH (ref 6–20)
CO2: 24 mmol/L (ref 22–32)
Calcium: 9.1 mg/dL (ref 8.9–10.3)
Chloride: 104 mmol/L (ref 98–111)
Creatinine, Ser: 1.2 mg/dL (ref 0.61–1.24)
GFR calc Af Amer: >60 mL/min (ref >60)
GFR calc non Af Amer: >60 mL/min (ref >60)
Glucose, Bld: 138 mg/dL — ABNORMAL HIGH (ref 70–99)
Potassium: 4.7 mmol/L (ref 3.5–5.1)
Sodium: 136 mmol/L (ref 135–145)

## 2019-06-11 LAB — GLUCOSE, CAPILLARY
Glucose-Capillary: 65 mg/dL — ABNORMAL LOW (ref 70–99)
Glucose-Capillary: 81 mg/dL (ref 70–99)
Glucose-Capillary: 180 mg/dL — ABNORMAL HIGH (ref 70–99)
Glucose-Capillary: 218 mg/dL — ABNORMAL HIGH (ref 70–99)

## 2019-06-11 NOTE — Progress Notes (Signed)
Performed walk test with pt. Pt remained on RA from beginning to end. Sats ranging from 90-96%. Pt ambulated around nurses station x1. No SOB, HR WNL. Pt had no complaints. Pt back in room on RA. Will continue to monitor

## 2019-06-11 NOTE — Progress Notes (Signed)
[facility] PROGRESS NOTE    Progress Note  Earl Griffin  ZOX:[id number] DOB: 06/07/1961 DOA: 06/05/2019 PCP: Marisue IvanLinthavong, Kanhka, MD     Brief Narrative:   Earl Griffin is an 58 y.o. male past medical history significant for atrial fibrillation on Eliquis, essential hypertension diabetes mellitus type 2 presents with [place] ED with worsening shortness of breath was found to be hypoxic, he relates his symptoms initially started on 05/31/2019 which progressively got worse in the ED chest x-ray showed bilateral infiltrates.  Assessment/Plan:   Acute respiratory failure with hypoxia (HCC) likely due to COVID-19 viral infection: Saturations remained greater than 90% on room air, his complete his course of IV Remdesivir and steroids. Check saturations with ambulation. His inflammatory markers are down.  Acute kidney injury on chronic kidney disease stage III/hyperkalemia: With a baseline creatinine of less than 1 likely prerenal azotemia  Chronic atrial fibrillation on anticoagulation: Controlled on diltiazem continue Eliquis.  Diabetes mellitus type 2, uncontrolled with hyperglycemia: Hemoglobin A1c was 10.3 his oral hypoglycemic agents were held on admission. He was started on long-acting insulin plus sliding scale and his oral hypoglycemic agents were restarted once his creatinine returned to baseline as he was on steroids which were making his blood glucose erratic. This morning he is mildly hypoglycemic he is insulins were held, will check CBG and at bedtime and monitor for 24 hours.  He is currently on metformin and glipizide.  Essential hypertension: Currently on his home dose of lisinopril and diltiazem, his blood pressure significantly improved this morning.  Hypovolemic hyponatremia: Resolved with IV fluid hydration.   DVT prophylaxis: lovenox Family Communication:none Disposition Plan/Barrier to D/C: once off oxygen Code Status:     Code Status Orders  (From  admission, onward)         Start     Ordered   06/06/19 0104  Full code  Continuous     06/06/19 0108        Code Status History    This patient has a current code status but no historical code status.   Advance Care Planning Activity        IV Access:    Peripheral IV   Procedures and diagnostic studies:   No results found.   Medical Consultants:    None.  Anti-Infectives:   None  Subjective:    Earl Griffin no complaints today.  Objective:    Vitals:   06/10/19 1631 06/10/19 1950 06/11/19 0455 06/11/19 0740  BP: (!) 145/100 131/87 123/84 120/84  Pulse: 66 70 61 65  Resp: 16 17 18 14   Temp: 98.4 F (36.9 C) 98.4 F (36.9 C) 98.1 F (36.7 C) 98.9 F (37.2 C)  TempSrc: Oral Oral Oral Oral  SpO2: 92% 95% 95% 94%  Weight:      Height:       SpO2: 94 % O2 Flow Rate (L/min): 3 L/min   Intake/Output Summary (Last 24 hours) at 06/11/2019 [id number] Last data filed at 06/11/2019 0500 Gross per 24 hour  Intake 1930 ml  Output 2100 ml  Net -170 ml   Filed Weights   06/05/19 2327  Weight: 111.6 kg    Exam: General exam: In no acute distress. Respiratory system: Good air movement and diffuse crackles bilaterally. Cardiovascular system: S1 & S2 heard, RRR. No JVD. Gastrointestinal system: Abdomen is nondistended, soft and nontender.  Central nervous system: Alert and oriented. No focal neurological deficits. Extremities: No pedal edema. Skin: No rashes, lesions or ulcers Psychiatry: Judgement and insight  appear normal. Mood & affect appropriate.   Data Reviewed:    Labs: Basic Metabolic Panel: Recent Labs  Lab 06/06/19 0310  06/07/19 0425 06/07/19 1235 06/08/19 0335 06/09/19 0412 06/10/19 0337 06/11/19 0327  NA 130*  --  136  --  137 135 136 136  K 5.4*   < > 5.2*  --  4.7 4.7 5.0 4.7  CL 98  --  102  --  103 102 102 104  CO2 22  --  25  --  24 24 25 24   GLUCOSE 432*  --  347* 431* 105* 175* 238* 138*  BUN 25*  --  36*  --  33* 31*  33* 33*  CREATININE 1.31*  --  1.18  --  1.16 1.18 1.13 1.20  CALCIUM 8.6*  --  9.1  --  9.2 9.2 9.5 9.1  MG 2.1  --  2.6*  --  2.3 2.1 2.2  --   PHOS 4.0  --   --   --   --   --   --   --    < > = values in this interval not displayed.   GFR Estimated Creatinine Clearance: 91.8 mL/min (by C-G formula based on SCr of 1.2 mg/dL). Liver Function Tests: Recent Labs  Lab 06/06/19 0310 06/07/19 0425 06/08/19 0335 06/09/19 0412 06/10/19 0337  AST 17 13* 18 22 16   ALT 17 16 19 25 30   ALKPHOS 64 68 65 63 70  BILITOT 1.1 0.8 0.7 0.6 0.8  PROT 7.1 7.2 6.7 6.5 6.8  ALBUMIN 3.1* 3.0* 3.0* 2.9* 3.1*   No results for input(s): LIPASE, AMYLASE in the last 168 hours. No results for input(s): AMMONIA in the last 168 hours. Coagulation profile No results for input(s): INR, PROTIME in the last 168 hours. COVID-19 Labs  Recent Labs    06/09/19 0412 06/10/19 0337  DDIMER 0.53* 0.57*  FERRITIN 312 297  CRP 1.2* 0.8    Lab Results  Component Value Date   SARSCOV2NAA DETECTED (A) 05/31/2019    CBC: Recent Labs  Lab 06/06/19 0310 06/07/19 0425 06/08/19 0335 06/09/19 0412 06/10/19 0337  WBC 6.3 9.8 11.0* 11.4* 12.0*  NEUTROABS 5.6 8.1* 8.9* 8.8* 8.7*  HGB 14.2 14.3 14.0 14.6 15.1  HCT 43.5 42.4 42.2 43.6 45.0  MCV 85.1 83.8 84.1 83.0 84.0  PLT 231 303 355 393 445*   Cardiac Enzymes: No results for input(s): CKTOTAL, CKMB, CKMBINDEX, TROPONINI in the last 168 hours. BNP (last 3 results) No results for input(s): PROBNP in the last 8760 hours. CBG: Recent Labs  Lab 06/10/19 1211 06/10/19 1629 06/10/19 2221 06/11/19 0735 06/11/19 0749  GLUCAP 183* 227* 204* 65* 81   D-Dimer: Recent Labs    06/09/19 0412 06/10/19 0337  DDIMER 0.53* 0.57*   Hgb A1c: No results for input(s): HGBA1C in the last 72 hours. Lipid Profile: No results for input(s): CHOL, HDL, LDLCALC, TRIG, CHOLHDL, LDLDIRECT in the last 72 hours. Thyroid function studies: No results for input(s): TSH,  T4TOTAL, T3FREE, THYROIDAB in the last 72 hours.  Invalid input(s): FREET3 Anemia work up: Recent Labs    06/09/19 0412 06/10/19 0337  FERRITIN 312 297   Sepsis Labs: Recent Labs  Lab 06/05/19 1801  06/07/19 0425 06/08/19 0335 06/09/19 0412 06/10/19 0337  PROCALCITON <0.10  --   --   --   --   --   WBC 6.5   < > 9.8 11.0* 11.4* 12.0*   < > =  values in this interval not displayed.   Microbiology No results found for this or any previous visit (from the past 240 hour(s)).   Medications:   . albuterol  2 puff Inhalation Q6H  . apixaban  5 mg Oral BID  . dextromethorphan-guaiFENesin  1 tablet Oral BID  . diltiazem  120 mg Oral Daily  . famotidine  20 mg Oral Daily  . glimepiride  4 mg Oral BID WC  . insulin aspart  0-20 Units Subcutaneous TID WC  . insulin aspart  0-5 Units Subcutaneous QHS  . insulin aspart  6 Units Subcutaneous TID WC  . insulin glargine  40 Units Subcutaneous BID  . lisinopril  40 mg Oral Daily  . metFORMIN  1,000 mg Oral BID WC  . Ensure Max Protein  11 oz Oral BID  . vitamin C  500 mg Oral Daily  . zinc sulfate  220 mg Oral Daily   Continuous Infusions: . sodium chloride 10 mL/hr at 06/07/19 2154      LOS: 6 days   Marinda Elk  [facility]  06/11/2019, 8:22 AM

## 2019-06-11 NOTE — Progress Notes (Signed)
Michelle updated 

## 2019-06-12 LAB — BASIC METABOLIC PANEL
Anion gap: 10 (ref 5–15)
BUN: 31 mg/dL — ABNORMAL HIGH (ref 6–20)
CO2: 22 mmol/L (ref 22–32)
Calcium: 9.4 mg/dL (ref 8.9–10.3)
Chloride: 104 mmol/L (ref 98–111)
Creatinine, Ser: 1.16 mg/dL (ref 0.61–1.24)
GFR calc Af Amer: >60 mL/min (ref >60)
GFR calc non Af Amer: >60 mL/min (ref >60)
Glucose, Bld: 117 mg/dL — ABNORMAL HIGH (ref 70–99)
Potassium: 5 mmol/L (ref 3.5–5.1)
Sodium: 136 mmol/L (ref 135–145)

## 2019-06-12 LAB — GLUCOSE, CAPILLARY
Glucose-Capillary: 197 mg/dL — ABNORMAL HIGH (ref 70–99)
Glucose-Capillary: 177 mg/dL — ABNORMAL HIGH (ref 70–99)
Glucose-Capillary: 189 mg/dL — ABNORMAL HIGH (ref 70–99)

## 2019-06-12 NOTE — Progress Notes (Signed)
Discharge instructions given at this time. Patient verified understanding. No acute distress noted. Awaiting family to pick him up.

## 2019-06-12 NOTE — TOC Transition Note (Signed)
Transition of Care [facility]) - CM/SW Discharge Note   Patient Details  Name: Earl Griffin MRN: [id number] Date of Birth: [birth year]  [facility] CM/SW Contact:  Ninfa Meeker, RN Phone Number: [phone] (working remotely) 06/12/2019, 12:43 PM   Clinical Narrative:  58 yr old gentleman admitted and treated for COVID 19. Thankfully he is improving and will discharge home. Patient has no identified needs.           Patient Goals and CMS Choice        Discharge Placement                       Discharge Plan and Services                                     Social Determinants of Health (SDOH) Interventions     Readmission Risk Interventions No flowsheet data found.

## 2019-06-12 NOTE — Discharge Summary (Signed)
Physician Discharge Summary  Massimiliano Rohleder DHR:[id number] DOB: Nov 07, 1960 DOA: 06/05/2019  PCP: Marisue Ivan, MD  Admit date: 06/05/2019 Discharge date: 06/12/2019  Admitted From: Home Disposition:  Home  Recommendations for Outpatient Follow-up:  1. Follow up with PCP in 1-2 weeks   Home Health:No Equipment/Devices:None  Discharge Condition:Stable CODE STATUS:Full Diet recommendation: Heart Healthy  Brief/Interim Summary: 58 year old with past medical history of atrial fibrillation on Eliquis, essential hypertension, diabetes mellitus type 2 who presents to the [facility] ED with shortness of breath cough and was found to be hypoxic touch with positive COVID-19 and chest x-ray showed bilateral infiltrates.  Discharge Diagnoses:  Active Problems:   Acute respiratory failure with hypoxia (HCC)   Chronic a-fib   Essential hypertension   Type 2 diabetes mellitus with hyperlipidemia (HCC)   Hyperglycemia   AKI (acute kidney injury) (HCC)   Hyponatremia Acute respiratory failure with hypoxia likely due to COVID-19 viral infection: Started on supplemental oxygen, he completed his course of 5 days of IV steroids and IV Remdesivir. His saturations were checked with ambulation which remained stable.  Acute kidney injury on chronic kidney disease stage III/hyperkalemia: With a baseline creatinine of less than 1, likely prerenal azotemia resolved with IV fluid hydration.  Chronic atrial fibrillation on anticoagulation: No changes made to his medication.  Diabetes mellitus type 2 uncontrolled with hyperglycemia:  A1c of 10.3, oral hypoglycemic agents were held in admission he was started on long-acting insulin plus sliding scale, his blood glucose was erratic likely due to steroids. Once he completed his steroid course he was changed back to his oral hypoglycemic agents and his fasting blood glucose was 116.  Essential hypertension: No changes made to his medication.  Hypovolemic  hyponatremia: Resolved with IV fluid hydration.   Discharge Instructions  Discharge Instructions    Diet - low sodium heart healthy   Complete by: As directed    Increase activity slowly   Complete by: As directed      Allergies as of 06/12/2019   No Known Allergies     Medication List    TAKE these medications   apixaban 5 MG Tabs tablet Commonly known as: Eliquis Take 1 tablet (5 mg total) by mouth 2 (two) times daily.   diltiazem 120 MG tablet Commonly known as: CARDIZEM Take 120 mg by mouth daily.   glimepiride 4 MG tablet Commonly known as: AMARYL Take 1 tablet by mouth 2 (two) times daily.   hydrochlorothiazide 12.5 MG tablet Commonly known as: HYDRODIURIL Take 12.5 mg by mouth daily.   lisinopril 40 MG tablet Commonly known as: ZESTRIL Take 40 mg by mouth daily.   metFORMIN 1000 MG tablet Commonly known as: GLUCOPHAGE Take 1 tablet by mouth 2 (two) times daily.       No Known Allergies  Consultations:  None   Procedures/Studies: Dg Chest Portable 1 View  Result Date: 06/05/2019 CLINICAL DATA:  Shortness of breath in a COVID-19 positive patient. EXAM: PORTABLE CHEST 1 VIEW COMPARISON:  PA and lateral chest 01/19/2016. FINDINGS: There is patchy bilateral airspace disease with a mainly peripheral distribution. No pneumothorax or pleural fluid. Heart size is normal. No acute or focal bony abnormality. IMPRESSION: Patchy bilateral airspace disease consistent with pneumonia. Viral/atypical infection is within the differential. Electronically Signed   By: Drusilla Kanner M.D.   On: 06/05/2019 18:28    (Echo, Carotid, EGD, Colonoscopy, ERCP)    Subjective: No Complains, feels great.  Discharge Exam: Vitals:   06/11/19 2046 06/12/19 0318  BP:  125/81 116/80  Pulse: 72   Resp:    Temp: 98.1 F (36.7 C) 97.9 F (36.6 C)  SpO2:     Vitals:   06/11/19 1551 06/11/19 1955 06/11/19 2046 06/12/19 0318  BP: 114/77  125/81 116/80  Pulse: 64  72    Resp: 15     Temp: 97.6 F (36.4 C) 98 F (36.7 C) 98.1 F (36.7 C) 97.9 F (36.6 C)  TempSrc: Oral   Oral  SpO2: 92%     Weight:      Height:        General: Pt is alert, awake, not in acute distress Cardiovascular: RRR, S1/S2 +, no rubs, no gallops Respiratory: CTA bilaterally, no wheezing, no rhonchi Abdominal: Soft, NT, ND, bowel sounds + Extremities: no edema, no cyanosis    The results of significant diagnostics from this hospitalization (including imaging, microbiology, ancillary and laboratory) are listed below for reference.     Microbiology: No results found for this or any previous visit (from the past 240 hour(s)).   Labs: BNP (last 3 results) No results for input(s): BNP in the last 8760 hours. Basic Metabolic Panel: Recent Labs  Lab 06/06/19 0310  06/07/19 0425  06/08/19 0335 06/09/19 0412 06/10/19 0337 06/11/19 0327 06/12/19 0320  NA 130*  --  136  --  137 135 136 136 136  K 5.4*   < > 5.2*  --  4.7 4.7 5.0 4.7 5.0  CL 98  --  102  --  103 102 102 104 104  CO2 22  --  25  --  24 24 25 24 22   GLUCOSE 432*  --  347*   < > 105* 175* 238* 138* 117*  BUN 25*  --  36*  --  33* 31* 33* 33* 31*  CREATININE 1.31*  --  1.18  --  1.16 1.18 1.13 1.20 1.16  CALCIUM 8.6*  --  9.1  --  9.2 9.2 9.5 9.1 9.4  MG 2.1  --  2.6*  --  2.3 2.1 2.2  --   --   PHOS 4.0  --   --   --   --   --   --   --   --    < > = values in this interval not displayed.   Liver Function Tests: Recent Labs  Lab 06/06/19 0310 06/07/19 0425 06/08/19 0335 06/09/19 0412 06/10/19 0337  AST 17 13* 18 22 16   ALT 17 16 19 25 30   ALKPHOS 64 68 65 63 70  BILITOT 1.1 0.8 0.7 0.6 0.8  PROT 7.1 7.2 6.7 6.5 6.8  ALBUMIN 3.1* 3.0* 3.0* 2.9* 3.1*   No results for input(s): LIPASE, AMYLASE in the last 168 hours. No results for input(s): AMMONIA in the last 168 hours. CBC: Recent Labs  Lab 06/06/19 0310 06/07/19 0425 06/08/19 0335 06/09/19 0412 06/10/19 0337  WBC 6.3 9.8 11.0* 11.4*  12.0*  NEUTROABS 5.6 8.1* 8.9* 8.8* 8.7*  HGB 14.2 14.3 14.0 14.6 15.1  HCT 43.5 42.4 42.2 43.6 45.0  MCV 85.1 83.8 84.1 83.0 84.0  PLT 231 303 355 393 445*   Cardiac Enzymes: No results for input(s): CKTOTAL, CKMB, CKMBINDEX, TROPONINI in the last 168 hours. BNP: Invalid input(s): POCBNP CBG: Recent Labs  Lab 06/10/19 2221 06/11/19 0735 06/11/19 0749 06/11/19 1125 06/11/19 2035  GLUCAP 204* 65* 81 180* 218*   D-Dimer Recent Labs    06/10/19 0337  DDIMER 0.57*   Hgb A1c No  results for input(s): HGBA1C in the last 72 hours. Lipid Profile No results for input(s): CHOL, HDL, LDLCALC, TRIG, CHOLHDL, LDLDIRECT in the last 72 hours. Thyroid function studies No results for input(s): TSH, T4TOTAL, T3FREE, THYROIDAB in the last 72 hours.  Invalid input(s): FREET3 Anemia work up Recent Labs    06/10/19 0337  FERRITIN 297   Urinalysis    Component Value Date/Time   COLORURINE Yellow 07/07/2012 2337   APPEARANCEUR Clear 07/07/2012 2337   LABSPEC 1.025 07/07/2012 2337   PHURINE 5.0 07/07/2012 2337   GLUCOSEU >=500 07/07/2012 2337   HGBUR 1+ 07/07/2012 2337   BILIRUBINUR Negative 07/07/2012 2337   KETONESUR Negative 07/07/2012 2337   PROTEINUR Negative 07/07/2012 2337   NITRITE Negative 07/07/2012 2337   LEUKOCYTESUR Negative 07/07/2012 2337   Sepsis Labs Invalid input(s): PROCALCITONIN,  WBC,  LACTICIDVEN Microbiology No results found for this or any previous visit (from the past 240 hour(s)).   Time coordinating discharge: Over 30 minutes  SIGNED:   Charlynne Cousins, MD  [facility] 06/12/2019, 8:02 AM Pager   If 7PM-7AM, please contact night-coverage [email or web address] Password [email or web address]

## 2019-09-18 ENCOUNTER — Ambulatory Visit: Payer: Self-pay

## 2019-09-18 ENCOUNTER — Other Ambulatory Visit: Payer: Self-pay

## 2019-09-18 VITALS — BP 202/110 | HR 82 | Resp 16

## 2019-09-18 DIAGNOSIS — Z0130 Encounter for examination of blood pressure without abnormal findings: Secondary | ICD-10-CM

## 2019-09-18 DIAGNOSIS — I10 Essential (primary) hypertension: Secondary | ICD-10-CM

## 2019-09-18 DIAGNOSIS — R42 Dizziness and giddiness: Secondary | ICD-10-CM

## 2019-09-18 DIAGNOSIS — H1131 Conjunctival hemorrhage, right eye: Secondary | ICD-10-CM

## 2019-09-18 NOTE — Progress Notes (Signed)
   Subjective:    Patient ID: Earl Griffin, male    DOB: 1961-05-14, 59 y.o.   MRN: [id number]  Patient presents to clinic with concerns related to RIGHT eye having a burst blood vessel. States that he feels dizziness as he was walking to onsite clinic. Noted to be hypertensive at this time. Call placed to PCP for follow up.  Dizziness This is a new problem. The current episode started today. The problem occurs rarely. The problem has been unchanged. Associated symptoms include vertigo. The symptoms are aggravated by walking. He has tried rest for the symptoms. The treatment provided no relief.      Review of Systems  Eyes: Positive for redness.  Neurological: Positive for dizziness and vertigo.  All other systems reviewed and are negative.      Objective:   Physical Exam Vitals reviewed.  Constitutional:      Appearance: Normal appearance.  HENT:     Head: Normocephalic.     Nose: Nose normal.  Cardiovascular:     Rate and Rhythm: Normal rate and regular rhythm.     Pulses: Normal pulses.     Heart sounds: Normal heart sounds.  Pulmonary:     Effort: Pulmonary effort is normal.     Breath sounds: Normal breath sounds.  Musculoskeletal:        General: Normal range of motion.  Skin:    General: Skin is warm and dry.     Capillary Refill: Capillary refill takes less than 2 seconds.  Neurological:     Mental Status: He is alert and oriented to person, place, and time. Mental status is at baseline.  Psychiatric:        Mood and Affect: Mood normal.        Behavior: Behavior normal.     Assessment & Plan:   Wt Readings from Last 3 Encounters:  06/05/19 246 lb (111.6 kg)  06/05/19 248 lb (112.5 kg)  05/31/19 248 lb (112.5 kg)   Temp Readings from Last 3 Encounters:  06/12/19 (!) 97.5 F (36.4 C) (Oral)  06/05/19 99.9 F (37.7 C) (Oral)  05/31/19 99 F (37.2 C) (Oral)   BP Readings from Last 3 Encounters:  09/18/19 (!) 202/110  06/12/19 127/78  06/05/19  116/75   Pulse Readings from Last 3 Encounters:  09/18/19 82  06/12/19 69  06/05/19 83     No results found for: POCGLU         Thank you!!  Danise Edge RN  [place]  Occupational Health Nurse Specialist [facility] Direct Dial: [phone]  Cell:  [phone] Website: [email or web address]

## 2019-09-18 NOTE — Patient Instructions (Signed)
   Managing Your Hypertension Hypertension is commonly called high blood pressure. This is when the force of your blood pressing against the walls of your arteries is too strong. Arteries are blood vessels that carry blood from your heart throughout your body. Hypertension forces the heart to work harder to pump blood, and may cause the arteries to become narrow or stiff. Having untreated or uncontrolled hypertension can cause heart attack, stroke, kidney disease, and other problems. What are blood pressure readings? A blood pressure reading consists of a higher number over a lower number. Ideally, your blood pressure should be below 120/80. The first ("top") number is called the systolic pressure. It is a measure of the pressure in your arteries as your heart beats. The second ("bottom") number is called the diastolic pressure. It is a measure of the pressure in your arteries as the heart relaxes. What does my blood pressure reading mean? Blood pressure is classified into four stages. Based on your blood pressure reading, your health care provider may use the following stages to determine what type of treatment you need, if any. Systolic pressure and diastolic pressure are measured in a unit called mm Hg. Normal  Systolic pressure: below 120.  Diastolic pressure: below 80. Elevated  Systolic pressure: 120-129.  Diastolic pressure: below 80. Hypertension stage 1  Systolic pressure: 130-139.  Diastolic pressure: 80-89. Hypertension stage 2  Systolic pressure: 140 or above.  Diastolic pressure: 90 or above. What health risks are associated with hypertension? Managing your hypertension is an important responsibility. Uncontrolled hypertension can lead to:  A heart attack.  A stroke.  A weakened blood vessel (aneurysm).  Heart failure.  Kidney damage.  Eye damage.  Metabolic syndrome.  Memory and concentration problems. What changes can I make to manage my  hypertension? Hypertension can be managed by making lifestyle changes and possibly by taking medicines. Your health care provider will help you make a plan to bring your blood pressure within a normal range. Eating and drinking   Eat a diet that is high in fiber and potassium, and low in salt (sodium), added sugar, and fat. An example eating plan is called the DASH (Dietary Approaches to Stop Hypertension) diet. To eat this way: ? Eat plenty of fresh fruits and vegetables. Try to fill half of your plate at each meal with fruits and vegetables. ? Eat whole grains, such as whole wheat pasta, brown rice, or whole grain bread. Fill about one quarter of your plate with whole grains. ? Eat low-fat diary products. ? Avoid fatty cuts of meat, processed or cured meats, and poultry with skin. Fill about one quarter of your plate with lean proteins such as fish, chicken without skin, beans, eggs, and tofu. ? Avoid premade and processed foods. These tend to be higher in sodium, added sugar, and fat.  Reduce your daily sodium intake. Most people with hypertension should eat less than 1,500 mg of sodium a day.  Limit alcohol intake to no more than 1 drink a day for nonpregnant women and 2 drinks a day for men. One drink equals 12 oz of beer, 5 oz of wine, or 1 oz of hard liquor. Lifestyle  Work with your health care provider to maintain a healthy body weight, or to lose weight. Ask what an ideal weight is for you.  Get at least 30 minutes of exercise that causes your heart to beat faster (aerobic exercise) most days of the week. Activities may include walking, swimming, or biking.    Include exercise to strengthen your muscles (resistance exercise), such as weight lifting, as part of your weekly exercise routine. Try to do these types of exercises for 30 minutes at least 3 days a week.  Do not use any products that contain nicotine or tobacco, such as cigarettes and e-cigarettes. If you need help quitting,  ask your health care provider.  Control any long-term (chronic) conditions you have, such as high cholesterol or diabetes. Monitoring  Monitor your blood pressure at home as told by your health care provider. Your personal target blood pressure may vary depending on your medical conditions, your age, and other factors.  Have your blood pressure checked regularly, as often as told by your health care provider. Working with your health care provider  Review all the medicines you take with your health care provider because there may be side effects or interactions.  Talk with your health care provider about your diet, exercise habits, and other lifestyle factors that may be contributing to hypertension.  Visit your health care provider regularly. Your health care provider can help you create and adjust your plan for managing hypertension. Will I need medicine to control my blood pressure? Your health care provider may prescribe medicine if lifestyle changes are not enough to get your blood pressure under control, and if:  Your systolic blood pressure is 130 or higher.  Your diastolic blood pressure is 80 or higher. Take medicines only as told by your health care provider. Follow the directions carefully. Blood pressure medicines must be taken as prescribed. The medicine does not work as well when you skip doses. Skipping doses also puts you at risk for problems. Contact a health care provider if:  You think you are having a reaction to medicines you have taken.  You have repeated (recurrent) headaches.  You feel dizzy.  You have swelling in your ankles.  You have trouble with your vision. Get help right away if:  You develop a severe headache or confusion.  You have unusual weakness or numbness, or you feel faint.  You have severe pain in your chest or abdomen.  You vomit repeatedly.  You have trouble breathing. Summary  Hypertension is when the force of blood pumping  through your arteries is too strong. If this condition is not controlled, it may put you at risk for serious complications.  Your personal target blood pressure may vary depending on your medical conditions, your age, and other factors. For most people, a normal blood pressure is less than 120/80.  Hypertension is managed by lifestyle changes, medicines, or both. Lifestyle changes include weight loss, eating a healthy, low-sodium diet, exercising more, and limiting alcohol. This information is not intended to replace advice given to you by your health care provider. Make sure you discuss any questions you have with your health care provider. Document Revised: 12/23/2018 Document Reviewed: 07/29/2016 Elsevier Patient Education  2020 [employer]  

## 2019-10-19 ENCOUNTER — Emergency Department: Payer: BC Managed Care – PPO

## 2019-10-19 ENCOUNTER — Observation Stay
Admission: EM | Admit: 2019-10-19 | Discharge: 2019-10-20 | Disposition: A | Discharge status: Home / Self Care | Payer: BC Managed Care – PPO | Attending: Internal Medicine | Admitting: Internal Medicine

## 2019-10-19 ENCOUNTER — Other Ambulatory Visit: Payer: Self-pay

## 2019-10-19 DIAGNOSIS — I639 Cerebral infarction, unspecified: Secondary | ICD-10-CM | POA: Diagnosis present

## 2019-10-19 DIAGNOSIS — I10 Essential (primary) hypertension: Secondary | ICD-10-CM

## 2019-10-19 DIAGNOSIS — I482 Chronic atrial fibrillation, unspecified: Secondary | ICD-10-CM | POA: Diagnosis not present

## 2019-10-19 DIAGNOSIS — H538 Other visual disturbances: Secondary | ICD-10-CM | POA: Insufficient documentation

## 2019-10-19 DIAGNOSIS — I119 Hypertensive heart disease without heart failure: Secondary | ICD-10-CM | POA: Diagnosis not present

## 2019-10-19 DIAGNOSIS — Z794 Long term (current) use of insulin: Secondary | ICD-10-CM | POA: Insufficient documentation

## 2019-10-19 DIAGNOSIS — K219 Gastro-esophageal reflux disease without esophagitis: Secondary | ICD-10-CM | POA: Diagnosis not present

## 2019-10-19 DIAGNOSIS — Z87891 Personal history of nicotine dependence: Secondary | ICD-10-CM | POA: Insufficient documentation

## 2019-10-19 DIAGNOSIS — Z8616 Personal history of COVID-19: Secondary | ICD-10-CM | POA: Diagnosis not present

## 2019-10-19 DIAGNOSIS — Z79899 Other long term (current) drug therapy: Secondary | ICD-10-CM | POA: Insufficient documentation

## 2019-10-19 DIAGNOSIS — E1169 Type 2 diabetes mellitus with other specified complication: Secondary | ICD-10-CM | POA: Diagnosis not present

## 2019-10-19 DIAGNOSIS — Z7901 Long term (current) use of anticoagulants: Secondary | ICD-10-CM | POA: Diagnosis not present

## 2019-10-19 DIAGNOSIS — I071 Rheumatic tricuspid insufficiency: Secondary | ICD-10-CM | POA: Diagnosis not present

## 2019-10-19 DIAGNOSIS — E785 Hyperlipidemia, unspecified: Secondary | ICD-10-CM | POA: Diagnosis not present

## 2019-10-19 DIAGNOSIS — R519 Headache, unspecified: Secondary | ICD-10-CM | POA: Insufficient documentation

## 2019-10-19 DIAGNOSIS — Z7982 Long term (current) use of aspirin: Secondary | ICD-10-CM | POA: Diagnosis not present

## 2019-10-19 HISTORY — DX: Unspecified atrial fibrillation: I48.91

## 2019-10-19 LAB — BASIC METABOLIC PANEL
Anion gap: 11 (ref 5–15)
BUN: 17 mg/dL (ref 6–20)
CO2: 26 mmol/L (ref 22–32)
Calcium: 9.4 mg/dL (ref 8.9–10.3)
Chloride: 105 mmol/L (ref 98–111)
Creatinine, Ser: 1.31 mg/dL — ABNORMAL HIGH (ref 0.61–1.24)
GFR calc Af Amer: >60 mL/min (ref >60)
GFR calc non Af Amer: 60 mL/min — ABNORMAL LOW (ref >60)
Glucose, Bld: 204 mg/dL — ABNORMAL HIGH (ref 70–99)
Potassium: 3.8 mmol/L (ref 3.5–5.1)
Sodium: 142 mmol/L (ref 135–145)

## 2019-10-19 LAB — CBC
HCT: 44.7 % (ref 39.0–52.0)
Hemoglobin: 15.1 g/dL (ref 13.0–17.0)
MCH: 28 pg (ref 26.0–34.0)
MCHC: 33.8 g/dL (ref 30.0–36.0)
MCV: 82.9 fL (ref 80.0–100.0)
Platelets: 186 10*3/uL (ref 150–400)
RBC: 5.39 MIL/uL (ref 4.22–5.81)
RDW: 11.9 % (ref 11.5–15.5)
WBC: 4.3 10*3/uL (ref 4.0–10.5)
nRBC: 0 % (ref 0.0–0.2)

## 2019-10-19 NOTE — ED Triage Notes (Addendum)
Patient c/o headache X 2 days. Patient reports being hypertensive at PCP. Patient reports intermittent blurred vision since headache onset.

## 2019-10-20 ENCOUNTER — Observation Stay: Payer: BC Managed Care – PPO

## 2019-10-20 ENCOUNTER — Observation Stay
Admit: 2019-10-20 | Discharge: 2019-10-20 | Disposition: A | Discharge status: Home / Self Care | Payer: BC Managed Care – PPO | Attending: Internal Medicine | Admitting: Internal Medicine

## 2019-10-20 ENCOUNTER — Other Ambulatory Visit: Payer: Self-pay

## 2019-10-20 ENCOUNTER — Encounter: Payer: Self-pay | Admitting: Emergency Medicine

## 2019-10-20 DIAGNOSIS — I482 Chronic atrial fibrillation, unspecified: Secondary | ICD-10-CM | POA: Diagnosis not present

## 2019-10-20 DIAGNOSIS — Z7901 Long term (current) use of anticoagulants: Secondary | ICD-10-CM | POA: Diagnosis not present

## 2019-10-20 DIAGNOSIS — R9089 Other abnormal findings on diagnostic imaging of central nervous system: Secondary | ICD-10-CM | POA: Diagnosis not present

## 2019-10-20 DIAGNOSIS — I10 Essential (primary) hypertension: Secondary | ICD-10-CM | POA: Diagnosis not present

## 2019-10-20 DIAGNOSIS — E1169 Type 2 diabetes mellitus with other specified complication: Secondary | ICD-10-CM

## 2019-10-20 DIAGNOSIS — R519 Headache, unspecified: Secondary | ICD-10-CM | POA: Diagnosis not present

## 2019-10-20 DIAGNOSIS — E785 Hyperlipidemia, unspecified: Secondary | ICD-10-CM

## 2019-10-20 LAB — URINE DRUG SCREEN, QUALITATIVE (ARMC ONLY)
Amphetamines, Ur Screen: NOT DETECTED
Barbiturates, Ur Screen: NOT DETECTED
Benzodiazepine, Ur Scrn: NOT DETECTED
Cannabinoid 50 Ng, Ur ~~LOC~~: NOT DETECTED
Cocaine Metabolite,Ur ~~LOC~~: NOT DETECTED
MDMA (Ecstasy)Ur Screen: NOT DETECTED
Methadone Scn, Ur: NOT DETECTED
Opiate, Ur Screen: NOT DETECTED
Phencyclidine (PCP) Ur S: NOT DETECTED
Tricyclic, Ur Screen: NOT DETECTED

## 2019-10-20 LAB — HEPATIC FUNCTION PANEL
ALT: 16 U/L (ref 0–44)
AST: 15 U/L (ref 15–41)
Albumin: 4 g/dL (ref 3.5–5.0)
Alkaline Phosphatase: 80 U/L (ref 38–126)
Bilirubin, Direct: <0.1 mg/dL (ref 0.0–0.2)
Total Bilirubin: 0.5 mg/dL (ref 0.3–1.2)
Total Protein: 7.3 g/dL (ref 6.5–8.1)

## 2019-10-20 LAB — URINALYSIS, ROUTINE W REFLEX MICROSCOPIC
Bilirubin Urine: NEGATIVE
Glucose, UA: NEGATIVE mg/dL
Hgb urine dipstick: NEGATIVE
Ketones, ur: NEGATIVE mg/dL
Leukocytes,Ua: NEGATIVE
Nitrite: NEGATIVE
Protein, ur: NEGATIVE mg/dL
Specific Gravity, Urine: 1.014 (ref 1.005–1.030)
pH: 5 (ref 5.0–8.0)

## 2019-10-20 LAB — URINALYSIS, DIPSTICK ONLY
Bilirubin Urine: NEGATIVE
Glucose, UA: NEGATIVE mg/dL
Hgb urine dipstick: NEGATIVE
Ketones, ur: NEGATIVE mg/dL
Leukocytes,Ua: NEGATIVE
Nitrite: NEGATIVE
Protein, ur: NEGATIVE mg/dL
Specific Gravity, Urine: 1.014 (ref 1.005–1.030)
pH: 5 (ref 5.0–8.0)

## 2019-10-20 LAB — LIPID PANEL
Cholesterol: 155 mg/dL (ref 0–200)
HDL: 31 mg/dL — ABNORMAL LOW (ref >40)
LDL Cholesterol: 105 mg/dL — ABNORMAL HIGH (ref 0–99)
Total CHOL/HDL Ratio: 5 RATIO
Triglycerides: 97 mg/dL (ref <150)
VLDL: 19 mg/dL (ref 0–40)

## 2019-10-20 LAB — SARS CORONAVIRUS 2 (TAT 6-24 HRS): SARS Coronavirus 2: NEGATIVE

## 2019-10-20 LAB — APTT: aPTT: 31 seconds (ref 24–36)

## 2019-10-20 LAB — GLUCOSE, CAPILLARY
Glucose-Capillary: 167 mg/dL — ABNORMAL HIGH (ref 70–99)
Glucose-Capillary: 132 mg/dL — ABNORMAL HIGH (ref 70–99)
Glucose-Capillary: 242 mg/dL — ABNORMAL HIGH (ref 70–99)

## 2019-10-20 LAB — PROTIME-INR
INR: 1.1 (ref 0.8–1.2)
Prothrombin Time: 14 seconds (ref 11.4–15.2)

## 2019-10-20 LAB — HEMOGLOBIN A1C
Hgb A1c MFr Bld: 8.1 % — ABNORMAL HIGH (ref 4.8–5.6)
Mean Plasma Glucose: 185.77 mg/dL

## 2019-10-20 MED ORDER — ACETAMINOPHEN 160 MG/5ML PO SOLN
650.0000 mg | ORAL | Status: DC | PRN
  Filled 2019-10-20: qty 20.3

## 2019-10-20 MED ORDER — LISINOPRIL 20 MG PO TABS
40.0000 mg | ORAL_TABLET | Freq: Every day | ORAL | Status: DC
  Administered 2019-10-20: 12:00:00 40 mg via ORAL
  Filled 2019-10-20: qty 4
  Filled 2019-10-20: qty 2

## 2019-10-20 MED ORDER — INSULIN ASPART 100 UNIT/ML ~~LOC~~ SOLN
0.0000 [IU] | SUBCUTANEOUS | Status: DC
  Administered 2019-10-20: 2 [IU] via SUBCUTANEOUS
  Administered 2019-10-20: 1 [IU] via SUBCUTANEOUS
  Administered 2019-10-20: 3 [IU] via SUBCUTANEOUS
  Filled 2019-10-20 (×3): qty 1

## 2019-10-20 MED ORDER — SODIUM CHLORIDE 0.9 % IV SOLN: INTRAVENOUS | Status: DC

## 2019-10-20 MED ORDER — ACETAMINOPHEN 650 MG RE SUPP: 650.0000 mg | RECTAL | Status: DC | PRN

## 2019-10-20 MED ORDER — DILTIAZEM HCL 60 MG PO TABS
120.0000 mg | ORAL_TABLET | Freq: Every day | ORAL | Status: DC
  Administered 2019-10-20: 09:00:00 120 mg via ORAL
  Filled 2019-10-20: qty 2

## 2019-10-20 MED ORDER — STROKE: EARLY STAGES OF RECOVERY BOOK: Freq: Once | Status: AC

## 2019-10-20 MED ORDER — HYDROCHLOROTHIAZIDE 25 MG PO TABS
25.0000 mg | ORAL_TABLET | Freq: Every day | ORAL | Status: DC
  Filled 2019-10-20: qty 1

## 2019-10-20 MED ORDER — APIXABAN 5 MG PO TABS
5.0000 mg | ORAL_TABLET | Freq: Two times a day (BID) | ORAL | Status: DC
  Administered 2019-10-20: 5 mg via ORAL
  Filled 2019-10-20: qty 1

## 2019-10-20 MED ORDER — ACETAMINOPHEN 325 MG PO TABS: 650.0000 mg | ORAL_TABLET | ORAL | Status: DC | PRN

## 2019-10-20 MED ORDER — ASPIRIN 81 MG PO CHEW
81.0000 mg | CHEWABLE_TABLET | Freq: Every day | ORAL | Status: DC
  Administered 2019-10-20: 09:00:00 81 mg via ORAL
  Filled 2019-10-20: qty 1

## 2019-10-20 NOTE — ED Notes (Signed)
Spoke with Darl Pikes, Child psychotherapist- states there is no pending social consult and pt is ok for D/C on their side.

## 2019-10-20 NOTE — ED Notes (Signed)
Pt ate sandwich, chips - states that he can take meds. Denies nausea.

## 2019-10-20 NOTE — Care Management (Signed)
Incoming call from nurse states pt is being discharged and wanting to know if TOC had any concerns. No TOC consult ordered and no concerns per nurse. RN CM has not seen this patient and nurse had no concerns needing assessment other than potential code 44. RN has already spoken with [employer] department and no needs noted.

## 2019-10-20 NOTE — ED Notes (Signed)
Attempted to call social worker to clear pt for d/c without response. Will try again.

## 2019-10-20 NOTE — ED Notes (Signed)
pt is asking to be able to eat something prior to taking oral meds - states that meds make him nauseated on empty stomach. Sent attending a text asking for diet.

## 2019-10-20 NOTE — H&P (Signed)
Earl Griffin CZY:[id number] DOB: October 21, 1960 DOA: 10/19/2019     PCP: Marisue Ivan, MD    [facility]    Outpatient Specialists: NONE    Patient arrived to ER on 10/19/19 at 2126  Patient coming from: home Lives   With family    Chief Complaint:   Chief Complaint  Patient presents with  . Headache    HPI: Earl Griffin is a 59 y.o. male with medical history significant of Covid positive in September 2020, hypertension, atrial fibrillation on Eliquis, diabetes type 2    Presented with 2-day history of elevated blood pressure blurred vision and headache.  Headache was associated with blurred vision. He reports for the whole week he did not fell like himself. He was evaluated by his primary care provider and was instructed to follow follow-up if he gets better.  Patient used to be on HCTZ in the past but had to stop due to hypotension after COVID. Today his PCP asked him to restart. Today he woke up and felt that his generalized discomfort and headache was worse he presented to emergency department.  Where CT scan of the head showed subacute CVA  No fever no chills no cough, no shortness of breath.  No weakness no slurred speech no difficulty with ambulation.   Infectious risk factors:  Reports headache   In  ER  COVID TEST    PCR testing  Pending  Lab Results  Component Value Date   SARSCOV2NAA DETECTED (A) 05/31/2019     Regarding pertinent Chronic problems:      HTN on hydrochlorothiazide and lisinopril   DM 2 -  Lab Results  Component Value Date   HGBA1C 10.3 (H) 06/07/2019   on  PO meds only,      A. Fib -  - CHA2DS2 vas score >3:  current  on anticoagulation with   Eliquis,         -  Rate control:  Currently controlled with  Diltiazem,       CKD stage III - baseline Cr 1.2 Lab Results  Component Value Date   CREATININE 1.31 (H) 10/19/2019   CREATININE 1.16 06/12/2019   CREATININE 1.20 06/11/2019    While in ER: CT scan found evidence of  subacute ischemia  The following Work up has been ordered so far:  Orders Placed This Encounter  Procedures  . SARS CORONAVIRUS 2 (TAT 6-24 HRS) Nasopharyngeal Nasopharyngeal Swab  . CT Head Wo Contrast  . Basic metabolic panel  . CBC  . Urine Drug Screen, Qualitative  . Protime-INR  . Urinalysis, Routine w reflex microscopic  . APTT  . Hepatic function panel  . Lipid panel  . Cardiac monitoring  . Saline Lock IV, Maintain IV access  . Stroke swallow screen  . Initiate Carrier Fluid Protocol  . If O2 sat If O2 Sat < 94%, administer O2 at 2 liters/minute via nasal cannula.  . Consult to hospitalist  ALL PATIENTS BEING ADMITTED/HAVING PROCEDURES NEED COVID-19 SCREENING  . Pulse oximetry, continuous  . ED EKG  . Saline lock IV   Following Medications were ordered in ER: Medications - No data to display      Consult Orders  (From admission, onward)         Start     Ordered   10/20/19 0001  Consult to hospitalist  ALL PATIENTS BEING ADMITTED/HAVING PROCEDURES NEED COVID-19 SCREENING  Once    Comments: ALL PATIENTS BEING ADMITTED/HAVING PROCEDURES NEED COVID-19 SCREENING  Provider:  (Not yet assigned)  Question Answer Comment  Place call to: hospitalist   Reason for Consult Admit   Diagnosis/Clinical Info for Consult: subacute CVA with headache, permissive hypertension, needs full stroke workup [place], ED#3, [id number]     10/20/19 0001          Significant initial  Findings: Abnormal Labs Reviewed  BASIC METABOLIC PANEL - Abnormal; Notable for the following components:      Result Value   Glucose, Bld 204 (*)    Creatinine, Ser 1.31 (*)    GFR calc non Af Amer 60 (*)    All other components within normal limits     Otherwise labs showing:    Recent Labs  Lab 10/19/19 2143  NA 142  K 3.8  CO2 26  GLUCOSE 204*  BUN 17  CREATININE 1.31*  CALCIUM 9.4    Cr    Up from baseline see below Lab Results  Component Value Date   CREATININE 1.31 (H) 10/19/2019     CREATININE 1.16 06/12/2019   CREATININE 1.20 06/11/2019    No results for input(s): AST, ALT, ALKPHOS, BILITOT, PROT, ALBUMIN in the last 168 hours. Lab Results  Component Value Date   CALCIUM 9.4 10/19/2019   PHOS 4.0 06/06/2019     WBC      Component Value Date/Time   WBC 4.3 10/19/2019 2143      Plt: Lab Results  Component Value Date   PLT 186 10/19/2019     Lab Results  Component Value Date   SARSCOV2NAA DETECTED (A) 05/31/2019     HG/HCT  Stable,     Component Value Date/Time   HGB 15.1 10/19/2019 2143   HGB 16.2 11/29/2012 1023   HCT 44.7 10/19/2019 2143   HCT 48.8 11/29/2012 1023       ECG: Ordered Personally reviewed by me showing: HR : 68 Rhythm NSR,    no evidence of ischemic changes QTC 412    DM  labs:  HbA1C: Recent Labs    06/06/19 0310 06/07/19 0425  HGBA1C 10.0* 10.3*       UA  ordered   Urine analysis:    Component Value Date/Time   COLORURINE Yellow 07/07/2012 2337   APPEARANCEUR Clear 07/07/2012 2337   LABSPEC 1.025 07/07/2012 2337   PHURINE 5.0 07/07/2012 2337   GLUCOSEU >=500 07/07/2012 2337   HGBUR 1+ 07/07/2012 2337   BILIRUBINUR Negative 07/07/2012 2337   KETONESUR Negative 07/07/2012 2337   PROTEINUR Negative 07/07/2012 2337   NITRITE Negative 07/07/2012 2337   LEUKOCYTESUR Negative 07/07/2012 2337        CT HEAD evidence of subacute CVA   CXR -  Ordered     ED Triage Vitals [10/19/19 2139]  Enc Vitals Group     BP (!) 162/105     Pulse Rate 74     Resp 17     Temp 97.9 F (36.6 C)     Temp src      SpO2 98 %     Weight 246 lb (111.6 kg)     Height 6\' 4"  (1.93 m)     Head Circumference      Peak Flow      Pain Score 7     Pain Loc      Pain Edu?      Excl. in [place]?   VELF(81)@       Latest  Blood pressure (!) 162/105, pulse 74, temperature 97.9 F (36.6 C), resp.  rate 17, height 6\' 4"  (1.93 m), weight 111.6 kg, SpO2 98 %.     Hospitalist was called for admission for likely CVA   Review of  Systems:    Pertinent positives include: Headache blurred vision  Constitutional:  No weight loss, night sweats, Fevers, chills, fatigue, weight loss  HEENT:  No headaches, Difficulty swallowing,Tooth/dental problems,Sore throat,  No sneezing, itching, ear ache, nasal congestion, post nasal drip,  Cardio-vascular:  No chest pain, Orthopnea, PND, anasarca, dizziness, palpitations.no Bilateral lower extremity swelling  GI:  No heartburn, indigestion, abdominal pain, nausea, vomiting, diarrhea, change in bowel habits, loss of appetite, melena, blood in stool, hematemesis Resp:  no shortness of breath at rest. No dyspnea on exertion, No excess mucus, no productive cough, No non-productive cough, No coughing up of blood.No change in color of mucus.No wheezing. Skin:  no rash or lesions. No jaundice GU:  no dysuria, change in color of urine, no urgency or frequency. No straining to urinate.  No flank pain.  Musculoskeletal:  No joint pain or no joint swelling. No decreased range of motion. No back pain.  Psych:  No change in mood or affect. No depression or anxiety. No memory loss.  Neuro: no localizing neurological complaints, no tingling, no weakness, no double vision, no gait abnormality, no slurred speech, no confusion  All systems reviewed and apart from HOPI all are negative  Past Medical History:   Past Medical History:  Diagnosis Date  . A-fib (HCC)   . COVID-19 05/2019   Diagnosed Sept 2020, tested negative Dec 2020  . Diabetes mellitus without complication (HCC)   . GERD (gastroesophageal reflux disease)   . Hyperlipidemia   . Hypertension   . Mild left ventricular hypertrophy   . Unspecified atrial fibrillation [facility])       Past Surgical History:  Procedure Laterality Date  . NO PAST SURGERIES      Social History:  Ambulatory  Independently      reports that he quit smoking about 11 years ago. He has never used smokeless tobacco. He reports that he does not  drink alcohol or use drugs.    Family History:   Family History  Problem Relation Age of Onset  . Breast cancer Mother   . Hypertension Mother   . Diabetes Mother   . Heart attack Father   . Hypertension Father   . Diabetes Father     Allergies: No Known Allergies   Prior to Admission medications   Medication Sig Start Date End Date Taking? Authorizing Provider  apixaban (ELIQUIS) 5 MG TABS tablet Take 1 tablet (5 mg total) by mouth 2 (two) times daily. 12/02/15   12/04/15, MD  diltiazem (CARDIZEM) 120 MG tablet Take 120 mg by mouth daily.     [provider]  glimepiride (AMARYL) 4 MG tablet Take 1 tablet by mouth 2 (two) times daily. 01/18/18   [provider]  hydrochlorothiazide (HYDRODIURIL) 12.5 MG tablet Take 12.5 mg by mouth daily. 05/17/19   [provider]  lisinopril (ZESTRIL) 40 MG tablet Take 40 mg by mouth daily.  01/11/18   [provider]  metFORMIN (GLUCOPHAGE) 1000 MG tablet Take 1 tablet by mouth 2 (two) times daily. 12/02/17   [provider]   Physical Exam: Blood pressure (!) 162/105, pulse 74, temperature 97.9 F (36.6 C), resp. rate 17, height 6\' 4"  (1.93 m), weight 111.6 kg, SpO2 98 %. 1. General:  in No  Acute distress   Chronically ill  letter-appearing 2. Psychological: Alert and   Oriented 3. Head/ENT:    Dry Mucous Membranes                          Head Non traumatic, neck supple                           Poor Dentition 4. SKIN:   decreased Skin turgor,  Skin clean Dry and intact no rash 5. Heart: Regular rate and rhythm no  Murmur, no Rub or gallop 6. Lungs:  , no wheezes or crackles   7. Abdomen: Soft, non-tender, Non distended bowel sounds present 8. Lower extremities: no clubbing, cyanosis, no edema 9. Neurologically   strength 5 out of 5 in all 4 extremities cranial nerves II through XII intact slight devition of the mouth to the right per pt this is chronic 10. MSK: Normal range of  motion   All other LABS:     Recent Labs  Lab 10/19/19 2143  WBC 4.3  HGB 15.1  HCT 44.7  MCV 82.9  PLT 186     Recent Labs  Lab 10/19/19 2143  NA 142  K 3.8  CL 105  CO2 26  GLUCOSE 204*  BUN 17  CREATININE 1.31*  CALCIUM 9.4     No results for input(s): AST, ALT, ALKPHOS, BILITOT, PROT, ALBUMIN in the last 168 hours.     Cultures: No results found for: SDES, SPECREQUEST, CULT, REPTSTATUS   Radiological Exams on Admission: CT Head Wo Contrast  Result Date: 10/19/2019 CLINICAL DATA:  Headache for 2 days EXAM: CT HEAD WITHOUT CONTRAST TECHNIQUE: Contiguous axial images were obtained from the base of the skull through the vertex without intravenous contrast. COMPARISON:  None. FINDINGS: Brain: Geographic area of decreased attenuation is noted in the deep white matter on the left consistent with at least subacute ischemia. No findings to suggest acute hemorrhage, acute infarction or space-occupying mass lesion are seen. Vascular: No hyperdense vessel or unexpected calcification. Skull: Normal. Negative for fracture or focal lesion. Sinuses/Orbits: No acute finding. Other: None. IMPRESSION: Geographic area of decreased attenuation the deep white matter on the left as described. This likely represents an area of subacute ischemia. No acute abnormality noted. Electronically Signed   By: Alcide Clever M.D.   On: 10/19/2019 22:12    Chart has been reviewed    Assessment/Plan  59 y.o. male with medical history significant of Covid positive in September 2020, hypertension, atrial fibrillation on Eliquis, diabetes type 2  Admitted for CVA  Present on Admission: . CVA (cerebral vascular accident) [facility]) -  - will admit based on TIA/CVA protocol,        Monitor on Tele       MRA/MRI await  esults      We will order carotid Doppler         Echo to evaluate for possible embolic source,        obtain cardiac enzymes,  ECG,   Lipid panel, TSH.        Order PT/OT evaluation.         keep nothing by mouth until passes swallow eval        Will make sure patient is on antiplatelet ASA 81           May need to initiate statin       Allow permissive Hypertension keep BP <220/120  Neurology consult in a.m.    Marland Kitchen Chronic a-fib (HCC) -           - CHA2DS2 vas score 4 : continue current anticoagulation with  Eliquis,           -  Rate controled: Resume diltiazem in the morning if blood pressure allows           . Essential hypertension -chronic stable permissive hypertension today with resume home medications when able  . Type 2 diabetes mellitus with hyperlipidemia (HCC) -  - Order Sensitive SSI   -  check TSH and HgA1C  - Hold by mouth medications    Other plan as per orders.  DVT prophylaxis: Eliquis  Code Status:  FULL CODE   as per patient  I had personally discussed CODE STATUS with patient    Family Communication:   Family not at  Bedside   Disposition Plan:    To home once workup is complete and patient is stable                       Would benefit from PT/OT eval prior to DC  Ordered                 Consults called:  Please consult neurology in a.m.  Admission status:  ED Disposition    ED Disposition Condition Comment   Admit  Hospital Area: [facility] [[id number]]  Level of Care: Telemetry [5]  Covid Evaluation: Asymptomatic Screening Protocol (No Symptoms)  Diagnosis: CVA (cerebral vascular accident) [facility]) [[id number]]  Admitting Physician: Therisa Doyne [[id number]]  Attending Physician: Therisa Doyne [[id number]]       Obs       Level of care      tele   please discontinue once patient no longer qualifies   Precautions: admitted as  asymptomatic screening protocol  No active isolations    PPE: Used by the provider:   P100  eye Goggles,  Gloves      Lynnix Schoneman 10/20/2019, 1:05 AM    [facility]     after 2 AM please page floor coverage PA If 7AM-7PM, please contact the day team taking care  of the patient using [email or web address]   Patient was evaluated in the context of the global COVID-19 pandemic, which necessitated consideration that the patient might be at risk for infection with the SARS-CoV-2 virus that causes COVID-19. Institutional protocols and algorithms that pertain to the evaluation of patients at risk for COVID-19 are in a state of rapid change based on information released by regulatory bodies including the CDC and federal and state organizations. These policies and algorithms were followed during the patient's care.

## 2019-10-20 NOTE — ED Notes (Signed)
Pt given sandwich box. 

## 2019-10-20 NOTE — Consult Note (Signed)
Reason for Consult:Abnormal head CT  Requesting Physician: Myriam Forehand  CC: Headache, blurred vision  I have been asked by Dr. Myriam Forehand to see this patient in consultation for abnormal head CT.  HPI: Earl Griffin is an 59 y.o. male with a history of afib on Eliquis, DM, HTN and HLD who presented with 2-day history of elevated blood pressure, blurred vision and headache.  He reports for the whole week he did not fell like himself. He was evaluated by his primary care provider and was instructed to follow follow-up if he did not better.  Patient was on HCTZ in the past but had to stop due to hypotension after COVID. His PCP asked him to restart. Today he woke up and felt that his generalized discomfort and headache was worse he presented to emergency department head CT was felt to show a subacute CVA   Past Medical History:  Diagnosis Date  . A-fib (HCC)   . COVID-19 05/2019   Diagnosed Sept 2020, tested negative Dec 2020  . Diabetes mellitus without complication (HCC)   . GERD (gastroesophageal reflux disease)   . Hyperlipidemia   . Hypertension   . Mild left ventricular hypertrophy   . Unspecified atrial fibrillation [facility])     Past Surgical History:  Procedure Laterality Date  . NO PAST SURGERIES      Family History  Problem Relation Age of Onset  . Breast cancer Mother   . Hypertension Mother   . Diabetes Mother   . Heart attack Father   . Hypertension Father   . Diabetes Father     Social History:  reports that he quit smoking about 11 years ago. He has never used smokeless tobacco. He reports that he does not drink alcohol or use drugs.  No Known Allergies  Medications:  I have reviewed the patient's current medications. Prior to Admission:  Prior to Admission medications   Medication Sig Start Date End Date Taking? Authorizing Provider  apixaban (ELIQUIS) 5 MG TABS tablet Take 1 tablet (5 mg total) by mouth 2 (two) times daily. 12/02/15  Yes Paduchowski, Caryn Bee, MD  diltiazem  (CARDIZEM) 120 MG tablet Take 120 mg by mouth daily.    Yes [provider]  glimepiride (AMARYL) 4 MG tablet Take 1 tablet by mouth 2 (two) times daily. 01/18/18  Yes [provider]  hydrochlorothiazide (HYDRODIURIL) 25 MG tablet Take 25 mg by mouth daily.  05/17/19  Yes [provider]  lisinopril (ZESTRIL) 40 MG tablet Take 40 mg by mouth daily.  01/11/18  Yes [provider]  metFORMIN (GLUCOPHAGE) 1000 MG tablet Take 1 tablet by mouth 2 (two) times daily. 12/02/17  Yes [provider]     Scheduled: .  stroke: mapping our early stages of recovery book   Does not apply Once  . apixaban  5 mg Oral BID  . aspirin  81 mg Oral Daily  . diltiazem  120 mg Oral Daily  . hydrochlorothiazide  25 mg Oral Daily  . insulin aspart  0-9 Units Subcutaneous Q4H  . lisinopril  40 mg Oral Daily    ROS: History obtained from the patient  General ROS: negative for - chills, fatigue, fever, night sweats, weight gain or weight loss Psychological ROS: negative for - behavioral disorder, hallucinations, memory difficulties, mood swings or suicidal ideation Ophthalmic ROS: as noted in HPI ENT ROS: negative for - epistaxis, nasal discharge, oral lesions, sore throat, tinnitus or vertigo Allergy and Immunology ROS: negative for - hives  or itchy/watery eyes Hematological and Lymphatic ROS: negative for - bleeding problems, bruising or swollen lymph nodes Endocrine ROS: negative for - galactorrhea, hair pattern changes, polydipsia/polyuria or temperature intolerance Respiratory ROS: negative for - cough, hemoptysis, shortness of breath or wheezing Cardiovascular ROS: negative for - chest pain, dyspnea on exertion, edema or irregular heartbeat Gastrointestinal ROS: negative for - abdominal pain, diarrhea, hematemesis, nausea/vomiting or stool incontinence Genito-Urinary ROS: negative for - dysuria, hematuria, incontinence or urinary frequency/urgency Musculoskeletal ROS:  negative for - joint swelling or muscular weakness Neurological ROS: as noted in HPI Dermatological ROS: negative for rash and skin lesion changes  Physical Examination: Blood pressure (!) 162/109, pulse 65, temperature 97.9 F (36.6 C), resp. rate (!) 26, height 6\' 4"  (1.93 m), weight 111.6 kg, SpO2 97 %.  HEENT-  Normocephalic, no lesions, without obvious abnormality.  Normal external eye and conjunctiva.  Normal TM's bilaterally.  Normal auditory canals and external ears. Normal external nose, mucus membranes and septum.  Normal pharynx. Cardiovascular- S1, S2 normal, pulses palpable throughout   Lungs- chest clear, no wheezing, rales, normal symmetric air entry Abdomen- soft, non-tender; bowel sounds normal; no masses,  no organomegaly Extremities- no edema Lymph-no adenopathy palpable Musculoskeletal-no joint tenderness, deformity or swelling Skin-warm and dry, no hyperpigmentation, vitiligo, or suspicious lesions  Neurological Examination   Mental Status: Alert, oriented, thought content appropriate.  Speech fluent without evidence of aphasia.  Able to follow 3 step commands without difficulty. Cranial Nerves: II: Discs flat bilaterally; Visual fields grossly normal, pupils equal, round, reactive to light and accommodation III,IV, VI: ptosis not present, extra-ocular motions intact bilaterally V,VII: smile symmetric, facial light touch sensation normal bilaterally VIII: hearing normal bilaterally IX,X: gag reflex present XI: bilateral shoulder shrug XII: midline tongue extension Motor: Right : Upper extremity   5/5    Left:     Upper extremity   5/5  Lower extremity   5/5     Lower extremity   5/5 Tone and bulk:normal tone throughout; no atrophy noted Sensory: Pinprick and light touch intact throughout, bilaterally Deep Tendon Reflexes: Symmetric throughout Plantars: Right: downgoing   Left: downgoing Cerebellar: Normal finger-to-nose and normal heel-to-shin testing  bilaterally Gait: not tested due to safety concerns    Laboratory Studies:   Basic Metabolic Panel: Recent Labs  Lab 10/19/19 2143  NA 142  K 3.8  CL 105  CO2 26  GLUCOSE 204*  BUN 17  CREATININE 1.31*  CALCIUM 9.4    Liver Function Tests: Recent Labs  Lab 10/19/19 2143  AST 15  ALT 16  ALKPHOS 80  BILITOT 0.5  PROT 7.3  ALBUMIN 4.0   No results for input(s): LIPASE, AMYLASE in the last 168 hours. No results for input(s): AMMONIA in the last 168 hours.  CBC: Recent Labs  Lab 10/19/19 2143  WBC 4.3  HGB 15.1  HCT 44.7  MCV 82.9  PLT 186    Cardiac Enzymes: No results for input(s): CKTOTAL, CKMB, CKMBINDEX, TROPONINI in the last 168 hours.  BNP: Invalid input(s): POCBNP  CBG: Recent Labs  Lab 10/20/19 0230 10/20/19 0706  GLUCAP 167* 132*    Microbiology: Results for orders placed or performed during the hospital encounter of 10/19/19  SARS CORONAVIRUS 2 (TAT 6-24 HRS) Nasopharyngeal Nasopharyngeal Swab     Status: None   Collection Time: 10/20/19 12:15 AM   Specimen: Nasopharyngeal Swab  Result Value Ref Range Status   SARS Coronavirus 2 NEGATIVE NEGATIVE Final    Comment: (NOTE) SARS-CoV-2 target  nucleic acids are NOT DETECTED. The SARS-CoV-2 RNA is generally detectable in upper and lower respiratory specimens during the acute phase of infection. Negative results do not preclude SARS-CoV-2 infection, do not rule out co-infections with other pathogens, and should not be used as the sole basis for treatment or other patient management decisions. Negative results must be combined with clinical observations, patient history, and epidemiological information. The expected result is Negative. Fact Sheet for Patients: SugarRoll.be Fact Sheet for Healthcare Providers: https://www.woods-mathews.com/ This test is not yet approved or cleared by the [place] FDA and  has been authorized for detection and/or  diagnosis of SARS-CoV-2 by FDA under an Emergency Use Authorization (EUA). This EUA will remain  in effect (meaning this test can be used) for the duration of the COVID-19 declaration under Section 56 4(b)(1) of the Act, 21 U.S.C. section 360bbb-3(b)(1), unless the authorization is terminated or revoked sooner. Performed at [facility], [place] [address], [place], [place] [address]     Coagulation Studies: Recent Labs    10/20/19 0015  LABPROT 14.0  INR 1.1    Urinalysis:  Recent Labs  Lab 10/20/19 0659  COLORURINE YELLOW*  YELLOW*  LABSPEC 1.014  1.014  PHURINE 5.0  5.0  GLUCOSEU NEGATIVE  NEGATIVE  HGBUR NEGATIVE  NEGATIVE  BILIRUBINUR NEGATIVE  NEGATIVE  KETONESUR NEGATIVE  NEGATIVE  PROTEINUR NEGATIVE  NEGATIVE  NITRITE NEGATIVE  NEGATIVE  LEUKOCYTESUR NEGATIVE  NEGATIVE    Lipid Panel:     Component Value Date/Time   CHOL 155 10/19/2019 2143   CHOL 148 04/20/2012 0259   TRIG 97 10/19/2019 2143   TRIG 140 04/20/2012 0259   HDL 31 (L) 10/19/2019 2143   HDL 26 (L) 04/20/2012 0259   CHOLHDL 5.0 10/19/2019 2143   VLDL 19 10/19/2019 2143   VLDL 28 04/20/2012 0259   LDLCALC 105 (H) 10/19/2019 2143   LDLCALC 94 04/20/2012 0259    HgbA1C:  Lab Results  Component Value Date   HGBA1C 10.3 (H) 06/07/2019    Urine Drug Screen:      Component Value Date/Time   LABOPIA NONE DETECTED 10/20/2019 0659   COCAINSCRNUR NONE DETECTED 10/20/2019 0659   LABBENZ NONE DETECTED 10/20/2019 0659   AMPHETMU NONE DETECTED 10/20/2019 0659   THCU NONE DETECTED 10/20/2019 0659   LABBARB NONE DETECTED 10/20/2019 0659    Alcohol Level: No results for input(s): ETH in the last 168 hours.  Other results: EKG: sinus rhythm at 68 bpm.  Imaging: DG Chest 2 View  Result Date: 10/20/2019 CLINICAL DATA:  Headache and possible stroke EXAM: CHEST - 2 VIEW COMPARISON:  06/05/2019 FINDINGS: The heart size and mediastinal contours are within normal limits. Both lungs are  clear. The visualized skeletal structures are unremarkable. IMPRESSION: No active cardiopulmonary disease. Electronically Signed   By: Ulyses Jarred M.D.   On: 10/20/2019 01:33   CT Head Wo Contrast  Result Date: 10/19/2019 CLINICAL DATA:  Headache for 2 days EXAM: CT HEAD WITHOUT CONTRAST TECHNIQUE: Contiguous axial images were obtained from the base of the skull through the vertex without intravenous contrast. COMPARISON:  None. FINDINGS: Brain: Geographic area of decreased attenuation is noted in the deep white matter on the left consistent with at least subacute ischemia. No findings to suggest acute hemorrhage, acute infarction or space-occupying mass lesion are seen. Vascular: No hyperdense vessel or unexpected calcification. Skull: Normal. Negative for fracture or focal lesion. Sinuses/Orbits: No acute finding. Other: None. IMPRESSION: Geographic area of decreased attenuation the  deep white matter on the left as described. This likely represents an area of subacute ischemia. No acute abnormality noted. Electronically Signed   By: Alcide Clever M.D.   On: 10/19/2019 22:12   MR ANGIO HEAD WO CONTRAST  Result Date: 10/20/2019 CLINICAL DATA:  59 year old male with asymmetric left frontal lobe white matter hypodensity on head CT earlier tonight. Headache for 2 days. EXAM: MRA HEAD WITHOUT CONTRAST TECHNIQUE: Angiographic images of the Circle of Willis were obtained using MRA technique without intravenous contrast. COMPARISON:  Brain MRI today. FINDINGS: Antegrade flow in the posterior circulation. The distal left vertebral artery functionally terminates in PICA. The right vertebral primarily supplies the basilar. Patent right PICA origin. No distal vertebral stenosis identified. Patent basilar artery without stenosis. Fetal type bilateral PCA origins. Patent SCA origins. Bilateral PCA branches are within normal limits. Antegrade flow in both ICA siphons. No siphon stenosis. Normal ophthalmic and posterior  communicating artery origins. Patent carotid termini, MCA and ACA origins. The left A1 is dominant. Anterior communicating artery and visible bilateral ACA branches are within normal limits. Left MCA M1, bifurcation and visible left MCA branches are within normal limits. Right MCA M1, bifurcation and visible right MCA branches are within normal limits. IMPRESSION: Negative intracranial MRA. Electronically Signed   By: Odessa Fleming M.D.   On: 10/20/2019 05:17   MR BRAIN WO CONTRAST  Result Date: 10/20/2019 CLINICAL DATA:  59 year old male with asymmetric left frontal lobe white matter hypodensity on head CT earlier tonight. Headache for 2 days. EXAM: MRI HEAD WITHOUT CONTRAST TECHNIQUE: Multiplanar, multiecho pulse sequences of the brain and surrounding structures were obtained without intravenous contrast. COMPARISON:  Head CT 10/19/2019. FINDINGS: Brain: No restricted diffusion to suggest acute infarction. No midline shift, mass effect, evidence of mass lesion, ventriculomegaly, extra-axial collection or acute intracranial hemorrhage. Cervicomedullary junction within normal limits. Partially empty sella. Patchy and widely scattered bilateral cerebral white matter T2 and FLAIR hyperintensity. Some of this in the left corona radiata and external capsule corresponds to the earlier CT finding. But the extent is fairly symmetric otherwise. No associated cortical encephalomalacia or chronic cerebral blood products. Deep gray nuclei, brainstem and cerebellum remain within normal limits. Vascular: Major intracranial vascular flow voids are preserved. Skull and upper cervical spine: Degenerative ligamentous hypertrophy suspected about the odontoid. Otherwise negative visible cervical spine. Normal bone marrow signal. Sinuses/Orbits: Negative orbits. Paranasal sinuses and mastoids are stable and well pneumatized. Other: Visible internal auditory structures appear normal. Scalp and face soft tissues appear negative. IMPRESSION:  1. No acute intracranial abnormality. 2. Moderately advanced nonspecific cerebral white matter signal changes, most commonly due to chronic small vessel disease, and corresponds to the earlier CT finding. 3. Partially empty sella, often a normal anatomic variant but can be associated with idiopathic intracranial hypertension (pseudotumor cerebri). Electronically Signed   By: Odessa Fleming M.D.   On: 10/20/2019 05:10     Assessment/Plan: 59 year old male with a history of afib on Eliquis, DM, HTN and HLD who presenting with 2-day history of elevated blood pressure, blurred vision and headache.  BP improved.  Patient now reports that he is at baseline.  Head CT felt to show subacute infarct.  MRI of the brain performed in follow up and personally reviewed.  MRI of the brain shows no acute changes.    Recommendations: 1. No further neurologic intervention is recommended at this time.  If further questions arise, please call or page at that time.  Thank you for allowing  neurology to participate in the care of this patient.  Patient to continue Eliquis.  Thana Farr, MD Neurology [phone] 10/20/2019, 10:35 AM

## 2019-10-20 NOTE — ED Notes (Signed)
MD Ayiku called and confirmed pt's d/c

## 2019-10-20 NOTE — Progress Notes (Signed)
OT Cancellation Note  Patient Details Name: Earl Griffin MRN: [id number] DOB: [birth year]   Cancelled Treatment:    Reason Eval/Treat Not Completed: OT screened, no needs identified, will sign off. Thank you for the OT consult. Order received and chart reviewed. Neurology noted to have signed off, pt BP back to baseline, and DC order in place. Spoke with pt primary RN who was in room with pt. Pt can be heard stating "I'm feeling strong and ready to go". RN states pt has been walking ad lib in room, denies functional deficits at this time. No skilled OT needs identified. Will sign off. Please re-consult if additional OT needs arise during this admission.   Rockney Ghee, M.S., OTR/L Ascom: [phone] 10/20/19, 12:42 PM

## 2019-10-20 NOTE — ED Notes (Addendum)
Messaged UR RN, admitting MD, PT, for D/C plan. UR nurse cleared pt for d/c. Awaiting response/confirmation from admission MD.

## 2019-10-20 NOTE — Discharge Summary (Addendum)
Physician Discharge Summary  Earl Griffin FGH:[id number] DOB: [birth year] DOA: 10/19/2019  PCP: Marisue Ivan, MD  Admit date: 10/19/2019 Discharge date: 10/20/2019  Discharge disposition: Home   Recommendations for Outpatient Follow-Up:   Follow-up with PCP in 1 week   Discharge Diagnosis:   Principal Problem:   Severe hypertension Active Problems:   Chronic a-fib (HCC)   Type 2 diabetes mellitus with hyperlipidemia (HCC)    Discharge Condition: Stable.  Diet recommendation: Low-salt and low sugar diet  Code status: Full code.    Hospital Course:   Earl Griffin is a 59 y.o. male with medical history significant of Covid positive in September 2020, hypertension, atrial fibrillation on Eliquis, diabetes type 2   He presented with 2-day history of elevated blood pressure, blurred vision and headache.   He said he used to be on hydrochlorothiazide 25 mg but this was discontinued because he had problems with low blood pressure after he developed COVID-19 infection.  He went to see his PCP yesterday and he was advised to resume hydrochlorothiazide at 25 mg daily.  However, when he went home, he still felt poorly so he came to the hospital for further evaluation.  CT head was concerning for subacute infarct.  However, MRI brain did not show any acute stroke.  He was evaluated by the neurologist, Dr. Thad Ranger.  From neurology standpoint, patient can be discharged home and follow-up as an outpatient.  Patient's symptoms have resolved and he is deemed stable for discharge to home today.    Discharge Exam:   Vitals:   10/20/19 0800 10/20/19 0815  BP: (!) 162/109   Pulse: 66 65  Resp: 16 (!) 26  Temp:    SpO2: 96% 97%   Vitals:   10/20/19 0730 10/20/19 0745 10/20/19 0800 10/20/19 0815  BP: (!) 152/113  (!) 162/109   Pulse: 70 61 66 65  Resp: 17 11 16  (!) 26  Temp:      SpO2: 96% 97% 96% 97%  Weight:      Height:         GEN: NAD SKIN: No rash EYES:  EOMI ENT: MMM CV: RRR PULM: CTA B ABD: soft, ND, NT, +BS CNS: AAO x 3, non focal EXT: No edema or tenderness   The results of significant diagnostics from this hospitalization (including imaging, microbiology, ancillary and laboratory) are listed below for reference.     Procedures and Diagnostic Studies:   DG Chest 2 View  Result Date: 10/20/2019 CLINICAL DATA:  Headache and possible stroke EXAM: CHEST - 2 VIEW COMPARISON:  06/05/2019 FINDINGS: The heart size and mediastinal contours are within normal limits. Both lungs are clear. The visualized skeletal structures are unremarkable. IMPRESSION: No active cardiopulmonary disease. Electronically Signed   By: 06/07/2019 M.D.   On: 10/20/2019 01:33   CT Head Wo Contrast  Result Date: 10/19/2019 CLINICAL DATA:  Headache for 2 days EXAM: CT HEAD WITHOUT CONTRAST TECHNIQUE: Contiguous axial images were obtained from the base of the skull through the vertex without intravenous contrast. COMPARISON:  None. FINDINGS: Brain: Geographic area of decreased attenuation is noted in the deep white matter on the left consistent with at least subacute ischemia. No findings to suggest acute hemorrhage, acute infarction or space-occupying mass lesion are seen. Vascular: No hyperdense vessel or unexpected calcification. Skull: Normal. Negative for fracture or focal lesion. Sinuses/Orbits: No acute finding. Other: None. IMPRESSION: Geographic area of decreased attenuation the deep white matter on the left as described. This likely  represents an area of subacute ischemia. No acute abnormality noted. Electronically Signed   By: Alcide Clever M.D.   On: 10/19/2019 22:12   MR ANGIO HEAD WO CONTRAST  Result Date: 10/20/2019 CLINICAL DATA:  59 year old male with asymmetric left frontal lobe white matter hypodensity on head CT earlier tonight. Headache for 2 days. EXAM: MRA HEAD WITHOUT CONTRAST TECHNIQUE: Angiographic images of the Circle of Willis were obtained using  MRA technique without intravenous contrast. COMPARISON:  Brain MRI today. FINDINGS: Antegrade flow in the posterior circulation. The distal left vertebral artery functionally terminates in PICA. The right vertebral primarily supplies the basilar. Patent right PICA origin. No distal vertebral stenosis identified. Patent basilar artery without stenosis. Fetal type bilateral PCA origins. Patent SCA origins. Bilateral PCA branches are within normal limits. Antegrade flow in both ICA siphons. No siphon stenosis. Normal ophthalmic and posterior communicating artery origins. Patent carotid termini, MCA and ACA origins. The left A1 is dominant. Anterior communicating artery and visible bilateral ACA branches are within normal limits. Left MCA M1, bifurcation and visible left MCA branches are within normal limits. Right MCA M1, bifurcation and visible right MCA branches are within normal limits. IMPRESSION: Negative intracranial MRA. Electronically Signed   By: Odessa Fleming M.D.   On: 10/20/2019 05:17   MR BRAIN WO CONTRAST  Result Date: 10/20/2019 CLINICAL DATA:  59 year old male with asymmetric left frontal lobe white matter hypodensity on head CT earlier tonight. Headache for 2 days. EXAM: MRI HEAD WITHOUT CONTRAST TECHNIQUE: Multiplanar, multiecho pulse sequences of the brain and surrounding structures were obtained without intravenous contrast. COMPARISON:  Head CT 10/19/2019. FINDINGS: Brain: No restricted diffusion to suggest acute infarction. No midline shift, mass effect, evidence of mass lesion, ventriculomegaly, extra-axial collection or acute intracranial hemorrhage. Cervicomedullary junction within normal limits. Partially empty sella. Patchy and widely scattered bilateral cerebral white matter T2 and FLAIR hyperintensity. Some of this in the left corona radiata and external capsule corresponds to the earlier CT finding. But the extent is fairly symmetric otherwise. No associated cortical encephalomalacia or  chronic cerebral blood products. Deep gray nuclei, brainstem and cerebellum remain within normal limits. Vascular: Major intracranial vascular flow voids are preserved. Skull and upper cervical spine: Degenerative ligamentous hypertrophy suspected about the odontoid. Otherwise negative visible cervical spine. Normal bone marrow signal. Sinuses/Orbits: Negative orbits. Paranasal sinuses and mastoids are stable and well pneumatized. Other: Visible internal auditory structures appear normal. Scalp and face soft tissues appear negative. IMPRESSION: 1. No acute intracranial abnormality. 2. Moderately advanced nonspecific cerebral white matter signal changes, most commonly due to chronic small vessel disease, and corresponds to the earlier CT finding. 3. Partially empty sella, often a normal anatomic variant but can be associated with idiopathic intracranial hypertension (pseudotumor cerebri). Electronically Signed   By: Odessa Fleming M.D.   On: 10/20/2019 05:10     Labs:   Basic Metabolic Panel: Recent Labs  Lab 10/19/19 2143  NA 142  K 3.8  CL 105  CO2 26  GLUCOSE 204*  BUN 17  CREATININE 1.31*  CALCIUM 9.4   GFR Estimated Creatinine Clearance: 84.1 mL/min (A) (by C-G formula based on SCr of 1.31 mg/dL (H)). Liver Function Tests: Recent Labs  Lab 10/19/19 2143  AST 15  ALT 16  ALKPHOS 80  BILITOT 0.5  PROT 7.3  ALBUMIN 4.0   No results for input(s): LIPASE, AMYLASE in the last 168 hours. No results for input(s): AMMONIA in the last 168 hours. Coagulation profile Recent Labs  Lab  10/20/19 0015  INR 1.1    CBC: Recent Labs  Lab 10/19/19 2143  WBC 4.3  HGB 15.1  HCT 44.7  MCV 82.9  PLT 186   Cardiac Enzymes: No results for input(s): CKTOTAL, CKMB, CKMBINDEX, TROPONINI in the last 168 hours. BNP: Invalid input(s): POCBNP CBG: Recent Labs  Lab 10/20/19 0230 10/20/19 0706  GLUCAP 167* 132*   D-Dimer No results for input(s): DDIMER in the last 72 hours. Hgb A1c No  results for input(s): HGBA1C in the last 72 hours. Lipid Profile Recent Labs    10/19/19 2143  CHOL 155  HDL 31*  LDLCALC 105*  TRIG 97  CHOLHDL 5.0   Thyroid function studies No results for input(s): TSH, T4TOTAL, T3FREE, THYROIDAB in the last 72 hours.  Invalid input(s): FREET3 Anemia work up No results for input(s): VITAMINB12, FOLATE, FERRITIN, TIBC, IRON, RETICCTPCT in the last 72 hours. Microbiology Recent Results (from the past 240 hour(s))  SARS CORONAVIRUS 2 (TAT 6-24 HRS) Nasopharyngeal Nasopharyngeal Swab     Status: None   Collection Time: 10/20/19 12:15 AM   Specimen: Nasopharyngeal Swab  Result Value Ref Range Status   SARS Coronavirus 2 NEGATIVE NEGATIVE Final    Comment: (NOTE) SARS-CoV-2 target nucleic acids are NOT DETECTED. The SARS-CoV-2 RNA is generally detectable in upper and lower respiratory specimens during the acute phase of infection. Negative results do not preclude SARS-CoV-2 infection, do not rule out co-infections with other pathogens, and should not be used as the sole basis for treatment or other patient management decisions. Negative results must be combined with clinical observations, patient history, and epidemiological information. The expected result is Negative. Fact Sheet for Patients: SugarRoll.be Fact Sheet for Healthcare Providers: https://www.woods-mathews.com/ This test is not yet approved or cleared by the [place] FDA and  has been authorized for detection and/or diagnosis of SARS-CoV-2 by FDA under an Emergency Use Authorization (EUA). This EUA will remain  in effect (meaning this test can be used) for the duration of the COVID-19 declaration under Section 56 4(b)(1) of the Act, 21 U.S.C. section 360bbb-3(b)(1), unless the authorization is terminated or revoked sooner. Performed at [facility], [place] [address], [place], [place] [address]      Discharge Instructions:    Discharge Instructions    Diet - low sodium heart healthy   Complete by: As directed    Diet Carb Modified   Complete by: As directed    Increase activity slowly   Complete by: As directed      Allergies as of 10/20/2019   No Known Allergies     Medication List    TAKE these medications   apixaban 5 MG Tabs tablet Commonly known as: Eliquis Take 1 tablet (5 mg total) by mouth 2 (two) times daily.   diltiazem 120 MG tablet Commonly known as: CARDIZEM Take 120 mg by mouth daily.   glimepiride 4 MG tablet Commonly known as: AMARYL Take 1 tablet by mouth 2 (two) times daily.   hydrochlorothiazide 25 MG tablet Commonly known as: HYDRODIURIL Take 25 mg by mouth daily.   lisinopril 40 MG tablet Commonly known as: ZESTRIL Take 40 mg by mouth daily.   metFORMIN 1000 MG tablet Commonly known as: GLUCOPHAGE Take 1 tablet by mouth 2 (two) times daily.         Time coordinating discharge: 28 minutes  Signed:  Eimi Viney  [facility] 10/20/2019, 11:01 AM

## 2019-10-20 NOTE — Progress Notes (Signed)
*  PRELIMINARY RESULTS* Echocardiogram 2D Echocardiogram has been performed.  Cristela Blue 10/20/2019, 2:27 PM

## 2019-10-20 NOTE — ED Notes (Signed)
Called floor to request stroke mapping pamphlet- floor states they are out of english edition.

## 2019-10-20 NOTE — ED Provider Notes (Signed)
[facility] Emergency Department Provider Note  ____________________________________________   First MD Initiated Contact with Patient 10/19/19 2338     (approximate)  I have reviewed the triage vital signs and the nursing notes.   HISTORY  Chief Complaint Headache    HPI Earl Griffin is a 59 y.o. male with medical history as listed below which notably includes history of hypertension and at least intermittent atrial fibrillation who is on Eliquis.  He was also diagnosed with COVID-19 in September 2020 but tested negative in December 2020 and has no respiratory symptoms.  He presents tonight for evaluation of about 2 days of gradually worsening head pressure and intermittent headache that is all throughout his head.  He saw his primary care doctor yesterday who said his exam seemed okay but he should come to the emergency department if his symptoms get worse.  He went to sleep for a nap tonight and when he woke up he said he felt like "my brain was swelling" and he intermittently has had some blurry vision since the headache began several days ago.  He came in for further evaluation.  He states that he has been compliant with his medication regimen including his blood pressure medicines and his Eliquis.  He denies numbness or tingling in any of his extremities that is acute in onset although he says that sometimes when he is working or doing something for a long time he gets tingling in his fingers but this is gone on for longer than 2 days.  He denies any difficulty ambulating, no word finding difficulties, no slurred speech.  He says he has had trouble with his memory but that goes back to September when he had COVID-19.  He denies any recent fever/chills, sore throat, chest pain, cough, shortness of breath, nausea, vomiting, and abdominal pain.  He describes the symptoms in his head is moderate and nothing in particular makes them better or worse.         Past Medical History:  Diagnosis Date  . A-fib ([place])   . COVID-19 05/2019   Diagnosed Sept 2020, tested negative Dec 2020  . Diabetes mellitus without complication ([place])   . GERD (gastroesophageal reflux disease)   . Hyperlipidemia   . Hypertension   . Mild left ventricular hypertrophy   . Unspecified atrial fibrillation [facility])     Patient Active Problem List   Diagnosis Date Noted  . Acute respiratory failure with hypoxia ([place]) 06/06/2019  . Chronic a-fib ([place]) 06/06/2019  . Essential hypertension 06/06/2019  . Type 2 diabetes mellitus with hyperlipidemia ([place]) 06/06/2019  . Hyperglycemia 06/06/2019  . AKI (acute kidney injury) ([place]) 06/06/2019  . Hyponatremia 06/06/2019    Past Surgical History:  Procedure Laterality Date  . NO PAST SURGERIES      Prior to Admission medications   Medication Sig Start Date End Date Taking? Authorizing Provider  apixaban (ELIQUIS) 5 MG TABS tablet Take 1 tablet (5 mg total) by mouth 2 (two) times daily. 12/02/15   Harvest Dark, MD  diltiazem (CARDIZEM) 120 MG tablet Take 120 mg by mouth daily.     [provider]  glimepiride (AMARYL) 4 MG tablet Take 1 tablet by mouth 2 (two) times daily. 01/18/18   [provider]  hydrochlorothiazide (HYDRODIURIL) 12.5 MG tablet Take 12.5 mg by mouth daily. 05/17/19   [provider]  lisinopril (ZESTRIL) 40 MG tablet Take 40 mg by mouth daily.  01/11/18   [provider]  metFORMIN (  GLUCOPHAGE) 1000 MG tablet Take 1 tablet by mouth 2 (two) times daily. 12/02/17   [provider]    Allergies Patient has no known allergies.  Family History  Problem Relation Age of Onset  . Breast cancer Mother   . Hypertension Mother   . Diabetes Mother   . Heart attack Father   . Hypertension Father   . Diabetes Father     Social History Social History   Tobacco Use  . Smoking status: Former Smoker    Quit date: 05/26/2008    Years since quitting: 11.4   . Smokeless tobacco: Never Used  Substance Use Topics  . Alcohol use: No  . Drug use: Never    Review of Systems Constitutional: No fever/chills Eyes: Occasional blurry vision over the last couple of days. ENT: No sore throat. Cardiovascular: Denies chest pain. Respiratory: Denies shortness of breath. Gastrointestinal: No abdominal pain.  No nausea, no vomiting.  No diarrhea.  No constipation. Genitourinary: Negative for dysuria. Musculoskeletal: Negative for neck pain.  Negative for back pain. Integumentary: Negative for rash. Neurological: Generalized intracranial pressure and headache with intermittent blurry vision.  No acute focal numbness nor weakness.  No coordination difficulties or difficulty with ambulation.  No word finding difficulties.   ____________________________________________   PHYSICAL EXAM:  VITAL SIGNS: ED Triage Vitals [10/19/19 2139]  Enc Vitals Group     BP (!) 162/105     Pulse Rate 74     Resp 17     Temp 97.9 F (36.6 C)     Temp src      SpO2 98 %     Weight 111.6 kg (246 lb)     Height 1.93 m (6\' 4" )     Head Circumference      Peak Flow      Pain Score 7     Pain Loc      Pain Edu?      Excl. in GC?     Constitutional: Alert and oriented.  Eyes: Conjunctivae are normal.  Pupils are miotic bilaterally and minimally responsive.  No nystagmus. Head: Atraumatic. Nose: No congestion/rhinnorhea. Mouth/Throat: Patient is wearing a mask. Neck: No stridor.  No meningeal signs.   Cardiovascular: Normal rate, regular rhythm. Good peripheral circulation. Grossly normal heart sounds. Respiratory: Normal respiratory effort.  No retractions. Gastrointestinal: Soft and nontender. No distention.  Musculoskeletal: No lower extremity tenderness nor edema. No gross deformities of extremities. Neurologic:  Normal speech and language. No gross focal neurologic deficits are appreciated.  He has good grip strength, good major muscle group strength in  upper and lower extremities, no cerebellar deficits including normal finger-to-nose testing and normal gait.  NIH stroke scale is 0. Skin:  Skin is warm, dry and intact. Psychiatric: Mood and affect are normal. Speech and behavior are normal.  ____________________________________________   LABS (all labs ordered are listed, but only abnormal results are displayed)  Labs Reviewed  BASIC METABOLIC PANEL - Abnormal; Notable for the following components:      Result Value   Glucose, Bld 204 (*)    Creatinine, Ser 1.31 (*)    GFR calc non Af Amer 60 (*)    All other components within normal limits  SARS CORONAVIRUS 2 (TAT 6-24 HRS)  CBC  URINE DRUG SCREEN, QUALITATIVE ([facility] ONLY)  PROTIME-INR  URINALYSIS, ROUTINE W REFLEX MICROSCOPIC  APTT  HEPATIC FUNCTION PANEL  LIPID PANEL   ____________________________________________  EKG  ED ECG REPORT I, , the attending  physician, personally viewed and interpreted this ECG.  Date: 10/20/2019 EKG Time: 00: 01 Rate: 68 Rhythm: normal sinus rhythm QRS Axis: Left axis deviation Intervals: normal ST/T Wave abnormalities: normal Narrative Interpretation: no evidence of acute ischemia  ____________________________________________  RADIOLOGY I, Loleta Rose, personally viewed and evaluated these images (plain radiographs) as part of my medical decision making, as well as reviewing the written report by the radiologist.  ED MD interpretation: Subacute infarction on head CT.  Official radiology report(s): CT Head Wo Contrast  Result Date: 10/19/2019 CLINICAL DATA:  Headache for 2 days EXAM: CT HEAD WITHOUT CONTRAST TECHNIQUE: Contiguous axial images were obtained from the base of the skull through the vertex without intravenous contrast. COMPARISON:  None. FINDINGS: Brain: Geographic area of decreased attenuation is noted in the deep white matter on the left consistent with at least subacute ischemia. No findings to suggest acute  hemorrhage, acute infarction or space-occupying mass lesion are seen. Vascular: No hyperdense vessel or unexpected calcification. Skull: Normal. Negative for fracture or focal lesion. Sinuses/Orbits: No acute finding. Other: None. IMPRESSION: Geographic area of decreased attenuation the deep white matter on the left as described. This likely represents an area of subacute ischemia. No acute abnormality noted. Electronically Signed   By: Alcide Clever M.D.   On: 10/19/2019 22:12    ____________________________________________   PROCEDURES   Procedure(s) performed (including Critical Care):  Procedures   ____________________________________________   INITIAL IMPRESSION / MDM / ASSESSMENT AND PLAN / ED COURSE  As part of my medical decision making, I reviewed the following data within the electronic MEDICAL RECORD NUMBER Nursing notes reviewed and incorporated, Labs reviewed , EKG interpreted , Old chart reviewed, Discussed with admitting physician , Notes from prior ED visits and [place] Controlled Substance Database   Differential diagnosis includes, but is not limited to, CVA/TIA, intracranial bleeding, uncontrolled hypertension, metabolic or electrolyte abnormality, medication or drug side effect.  The patient says that his blood pressure is usually well controlled in the 120s but today he is as high as 162/105 while in the ED.  He is neurologically intact with no gross abnormalities identified but he has had a couple of days of increased head pressure.  He has an area of subacute ischemia on his head CT which very well could explain both his blood pressure ("permissive hypertension") and the symptoms he is experiencing of head pain and pressure and even the blurry vision.  He is outside the window for TPA and he is already on Eliquis and he has an NIH stroke scale of 0 but I have ordered the rest of the "stroke labs" from the ED perspective.  I discussed with him getting an MRI for further  evaluation or admission now with plans for a full stroke work-up and neurology consult and he prefers the latter.  That is most likely the safest plan given that the neurologist will need to weigh the risks and benefits of his ongoing anticoagulation with the findings on the rest of the imaging he will receive during the work-up.  No indication for acute intervention at this moment.  Nursing stroke swallow screen is pending.  I placed a consult to the hospitalist for admission at approximately midnight.      Clinical Course as of Oct 19 25  Caleen Essex Oct 20, 2019  0021 Discussed by phone with Dr. Adela Glimpse who will admit.   [CF]    Clinical Course User Index [CF] Loleta Rose, MD     ____________________________________________  FINAL  CLINICAL IMPRESSION(S) / ED DIAGNOSES  Final diagnoses:  Cerebrovascular accident (CVA), unspecified mechanism (HCC)  Pressure in head  Acute nonintractable headache, unspecified headache type  Hypertension, unspecified type  Current use of long term anticoagulation     MEDICATIONS GIVEN DURING THIS VISIT:  Medications - No data to display   ED Discharge Orders    None      *Please note:  Valdis Bevill was evaluated in Emergency Department on 10/20/2019 for the symptoms described in the history of present illness. He was evaluated in the context of the global COVID-19 pandemic, which necessitated consideration that the patient might be at risk for infection with the SARS-CoV-2 virus that causes COVID-19. Institutional protocols and algorithms that pertain to the evaluation of patients at risk for COVID-19 are in a state of rapid change based on information released by regulatory bodies including the CDC and federal and state organizations. These policies and algorithms were followed during the patient's care in the ED.  Some ED evaluations and interventions may be delayed as a result of limited staffing during the pandemic.*  Note:  This document was  prepared using Dragon voice recognition software and may include unintentional dictation errors.   Loleta Rose, MD 10/20/19 [id number]

## 2019-10-20 NOTE — ED Notes (Signed)
Secretary paged MD Myriam Forehand due to no message-response yet.

## 2019-10-21 LAB — ECHOCARDIOGRAM COMPLETE
Height: 76 in
Weight: 3936 oz

## 2020-02-26 ENCOUNTER — Other Ambulatory Visit: Payer: Self-pay

## 2020-02-26 ENCOUNTER — Emergency Department
Admission: EM | Admit: 2020-02-26 | Discharge: 2020-02-26 | Disposition: A | Discharge status: Home / Self Care | Payer: BC Managed Care – PPO | Attending: Emergency Medicine | Admitting: Emergency Medicine

## 2020-02-26 DIAGNOSIS — Z5321 Procedure and treatment not carried out due to patient leaving prior to being seen by health care provider: Secondary | ICD-10-CM | POA: Insufficient documentation

## 2020-02-26 DIAGNOSIS — E1165 Type 2 diabetes mellitus with hyperglycemia: Secondary | ICD-10-CM | POA: Insufficient documentation

## 2020-02-26 LAB — CBC
HCT: 42.2 % (ref 39.0–52.0)
Hemoglobin: 15.1 g/dL (ref 13.0–17.0)
MCH: 29 pg (ref 26.0–34.0)
MCHC: 35.8 g/dL (ref 30.0–36.0)
MCV: 81.2 fL (ref 80.0–100.0)
Platelets: 209 10*3/uL (ref 150–400)
RBC: 5.2 MIL/uL (ref 4.22–5.81)
RDW: 11.8 % (ref 11.5–15.5)
WBC: 5.7 10*3/uL (ref 4.0–10.5)
nRBC: 0 % (ref 0.0–0.2)

## 2020-02-26 LAB — URINALYSIS, COMPLETE (UACMP) WITH MICROSCOPIC
Bacteria, UA: NONE SEEN
Bilirubin Urine: NEGATIVE
Glucose, UA: >=500 mg/dL — AB
Hgb urine dipstick: NEGATIVE
Ketones, ur: NEGATIVE mg/dL
Leukocytes,Ua: NEGATIVE
Nitrite: NEGATIVE
Protein, ur: NEGATIVE mg/dL
Specific Gravity, Urine: 1.023 (ref 1.005–1.030)
pH: 5 (ref 5.0–8.0)

## 2020-02-26 LAB — GLUCOSE, CAPILLARY: Glucose-Capillary: 310 mg/dL — ABNORMAL HIGH (ref 70–99)

## 2020-02-26 LAB — BASIC METABOLIC PANEL
Anion gap: 7 (ref 5–15)
BUN: 18 mg/dL (ref 6–20)
CO2: 27 mmol/L (ref 22–32)
Calcium: 9.5 mg/dL (ref 8.9–10.3)
Chloride: 104 mmol/L (ref 98–111)
Creatinine, Ser: 1.63 mg/dL — ABNORMAL HIGH (ref 0.61–1.24)
GFR calc Af Amer: 53 mL/min — ABNORMAL LOW (ref >60)
GFR calc non Af Amer: 45 mL/min — ABNORMAL LOW (ref >60)
Glucose, Bld: 353 mg/dL — ABNORMAL HIGH (ref 70–99)
Potassium: 4.3 mmol/L (ref 3.5–5.1)
Sodium: 138 mmol/L (ref 135–145)

## 2020-02-26 NOTE — ED Triage Notes (Signed)
Pt comes via POV from home with c/o hyperglycemia. Pt sates he thinks it was 450. Pt states he take Metformin.  Pt states this hasn't happened in a long time.  Pt states vision little blurry. Pt denies any other complaints.

## 2020-02-28 ENCOUNTER — Encounter: Payer: Self-pay | Admitting: Emergency Medicine

## 2020-02-28 ENCOUNTER — Emergency Department
Admission: EM | Admit: 2020-02-28 | Discharge: 2020-02-28 | Disposition: A | Discharge status: Home / Self Care | Payer: BC Managed Care – PPO | Attending: Emergency Medicine | Admitting: Emergency Medicine

## 2020-02-28 ENCOUNTER — Other Ambulatory Visit: Payer: Self-pay

## 2020-02-28 DIAGNOSIS — E1165 Type 2 diabetes mellitus with hyperglycemia: Secondary | ICD-10-CM | POA: Diagnosis not present

## 2020-02-28 DIAGNOSIS — I10 Essential (primary) hypertension: Secondary | ICD-10-CM | POA: Diagnosis not present

## 2020-02-28 DIAGNOSIS — Z79899 Other long term (current) drug therapy: Secondary | ICD-10-CM | POA: Insufficient documentation

## 2020-02-28 DIAGNOSIS — Z8616 Personal history of COVID-19: Secondary | ICD-10-CM | POA: Insufficient documentation

## 2020-02-28 DIAGNOSIS — Z7901 Long term (current) use of anticoagulants: Secondary | ICD-10-CM | POA: Insufficient documentation

## 2020-02-28 DIAGNOSIS — R739 Hyperglycemia, unspecified: Secondary | ICD-10-CM

## 2020-02-28 DIAGNOSIS — Z7984 Long term (current) use of oral hypoglycemic drugs: Secondary | ICD-10-CM | POA: Insufficient documentation

## 2020-02-28 DIAGNOSIS — Z87891 Personal history of nicotine dependence: Secondary | ICD-10-CM | POA: Diagnosis not present

## 2020-02-28 LAB — COMPREHENSIVE METABOLIC PANEL
ALT: 19 U/L (ref 0–44)
AST: 16 U/L (ref 15–41)
Albumin: 4.1 g/dL (ref 3.5–5.0)
Alkaline Phosphatase: 88 U/L (ref 38–126)
Anion gap: 9 (ref 5–15)
BUN: 22 mg/dL — ABNORMAL HIGH (ref 6–20)
CO2: 23 mmol/L (ref 22–32)
Calcium: 9.4 mg/dL (ref 8.9–10.3)
Chloride: 105 mmol/L (ref 98–111)
Creatinine, Ser: 1.35 mg/dL — ABNORMAL HIGH (ref 0.61–1.24)
GFR calc Af Amer: >60 mL/min (ref >60)
GFR calc non Af Amer: 57 mL/min — ABNORMAL LOW (ref >60)
Glucose, Bld: 302 mg/dL — ABNORMAL HIGH (ref 70–99)
Potassium: 4 mmol/L (ref 3.5–5.1)
Sodium: 137 mmol/L (ref 135–145)
Total Bilirubin: 0.9 mg/dL (ref 0.3–1.2)
Total Protein: 7.2 g/dL (ref 6.5–8.1)

## 2020-02-28 LAB — CBC WITH DIFFERENTIAL/PLATELET
Abs Immature Granulocytes: 0.03 10*3/uL (ref 0.00–0.07)
Basophils Absolute: 0.1 10*3/uL (ref 0.0–0.1)
Basophils Relative: 1 %
Eosinophils Absolute: 0 10*3/uL (ref 0.0–0.5)
Eosinophils Relative: 1 %
HCT: 42.6 % (ref 39.0–52.0)
Hemoglobin: 15.5 g/dL (ref 13.0–17.0)
Immature Granulocytes: 1 %
Lymphocytes Relative: 27 %
Lymphs Abs: 1.4 10*3/uL (ref 0.7–4.0)
MCH: 29.4 pg (ref 26.0–34.0)
MCHC: 36.4 g/dL — ABNORMAL HIGH (ref 30.0–36.0)
MCV: 80.8 fL (ref 80.0–100.0)
Monocytes Absolute: 0.5 10*3/uL (ref 0.1–1.0)
Monocytes Relative: 9 %
Neutro Abs: 3.2 10*3/uL (ref 1.7–7.7)
Neutrophils Relative %: 61 %
Platelets: 215 10*3/uL (ref 150–400)
RBC: 5.27 MIL/uL (ref 4.22–5.81)
RDW: 11.5 % (ref 11.5–15.5)
WBC: 5.2 10*3/uL (ref 4.0–10.5)
nRBC: 0 % (ref 0.0–0.2)

## 2020-02-28 LAB — URINALYSIS, COMPLETE (UACMP) WITH MICROSCOPIC
Bacteria, UA: NONE SEEN
Bilirubin Urine: NEGATIVE
Glucose, UA: >=500 mg/dL — AB
Hgb urine dipstick: NEGATIVE
Ketones, ur: NEGATIVE mg/dL
Leukocytes,Ua: NEGATIVE
Nitrite: NEGATIVE
Protein, ur: NEGATIVE mg/dL
Specific Gravity, Urine: 1.021 (ref 1.005–1.030)
pH: 5 (ref 5.0–8.0)

## 2020-02-28 LAB — GLUCOSE, CAPILLARY
Glucose-Capillary: 291 mg/dL — ABNORMAL HIGH (ref 70–99)
Glucose-Capillary: 231 mg/dL — ABNORMAL HIGH (ref 70–99)

## 2020-02-28 MED ORDER — SODIUM CHLORIDE 0.9 % IV BOLUS
1000.0000 mL | Freq: Once | INTRAVENOUS | Status: AC
  Administered 2020-02-28: 1000 mL via INTRAVENOUS

## 2020-02-28 NOTE — ED Provider Notes (Signed)
[facility] Emergency Department Provider Note   ____________________________________________   First MD Initiated Contact with Patient 02/28/20 [phone]     (approximate)  I have reviewed the triage vital signs and the nursing notes.   HISTORY  Chief Complaint Hyperglycemia    HPI Earl Griffin is a 59 y.o. male here for evaluation of elevated blood sugar  Patient checks his blood sugars at home and notices that its been the last couple weeks ranging for about 300 to as high as 450.  His prescribed medication including Metformin and Amaryl  Talk to his primary care doctor they recommended he come to the ER to get checked.  He has an appointment with them in a week.  Came a couple days ago but reports he had his grandchildren with him, could not stay in the waiting room  No recent illness no fevers or chills no nausea vomiting.  He wonders if he might be a little bit "dehydrated" just feels like he is a little dry.  Some thirst  Has not noticed increased urination.  No pain or burning.  No abdominal discomfort no chest pain no trouble breathing     Past Medical History:  Diagnosis Date  . A-fib (HCC)   . COVID-19 05/2019   Diagnosed Sept 2020, tested negative Dec 2020  . Diabetes mellitus without complication (HCC)   . GERD (gastroesophageal reflux disease)   . Hyperlipidemia   . Hypertension   . Mild left ventricular hypertrophy   . Unspecified atrial fibrillation [facility]     Patient Active Problem List   Diagnosis Date Noted  . Acute respiratory failure with hypoxia (HCC) 06/06/2019  . Chronic a-fib (HCC) 06/06/2019  . Severe hypertension 06/06/2019  . Type 2 diabetes mellitus with hyperlipidemia (HCC) 06/06/2019  . Hyperglycemia 06/06/2019  . Earl (acute kidney injury) (HCC) 06/06/2019  . Hyponatremia 06/06/2019    Past Surgical History:  Procedure Laterality Date  . NO PAST SURGERIES      Prior to Admission medications   Medication  Sig Start Date End Date Taking? Authorizing Provider  apixaban (ELIQUIS) 5 MG TABS tablet Take 1 tablet (5 mg total) by mouth 2 (two) times daily. 12/02/15   Minna Antis, MD  diltiazem (CARDIZEM) 120 MG tablet Take 120 mg by mouth daily.     [provider]  glimepiride (AMARYL) 4 MG tablet Take 1 tablet by mouth 2 (two) times daily. 01/18/18   [provider]  hydrochlorothiazide (HYDRODIURIL) 25 MG tablet Take 25 mg by mouth daily.  05/17/19   [provider]  lisinopril (ZESTRIL) 40 MG tablet Take 40 mg by mouth daily.  01/11/18   [provider]  metFORMIN (GLUCOPHAGE) 1000 MG tablet Take 1 tablet by mouth 2 (two) times daily. 12/02/17   [provider]    Allergies Patient has no known allergies.  Family History  Problem Relation Age of Onset  . Breast cancer Mother   . Hypertension Mother   . Diabetes Mother   . Heart attack Father   . Hypertension Father   . Diabetes Father     Social History Social History   Tobacco Use  . Smoking status: Former Smoker    Quit date: 05/26/2008    Years since quitting: 11.7  . Smokeless tobacco: Never Used  Vaping Use  . Vaping Use: Never used  Substance Use Topics  . Alcohol use: No  . Drug use: Never    Review of Systems Constitutional: No  fever/chills Eyes: No visual changes. ENT: Feels little dry Cardiovascular: Denies chest pain. Respiratory: Denies shortness of breath. Gastrointestinal: No abdominal pain.   Genitourinary: Negative for dysuria. Skin: Negative for rash. Neurological: Negative for headaches or weakness.    ____________________________________________   PHYSICAL EXAM:  VITAL SIGNS: ED Triage Vitals  Enc Vitals Group     BP 02/28/20 0502 (!) 147/96     Pulse Rate 02/28/20 0502 74     Resp 02/28/20 0502 14     Temp 02/28/20 0502 98.2 F (36.8 C)     Temp Source 02/28/20 0502 Oral     SpO2 02/28/20 0502 98 %     Weight 02/28/20 0501 235 lb (106.6 kg)      Height 02/28/20 0501 6\' 3"  (1.905 m)     Head Circumference --      Peak Flow --      Pain Score 02/28/20 0501 0     Pain Loc --      Pain Edu? --      Excl. in GC? --     Constitutional: Alert and oriented. Well appearing and in no acute distress. Eyes: Conjunctivae are normal. Head: Atraumatic. Nose: No congestion/rhinnorhea. Mouth/Throat: Mucous membranes are moist. Neck: No stridor.  Cardiovascular: Normal rate, regular rhythm. Grossly normal heart sounds.  Good peripheral circulation. Respiratory: Normal respiratory effort.  No retractions. Lungs CTAB. Gastrointestinal: Soft and nontender. No distention. Musculoskeletal: No lower extremity tenderness nor edema. Neurologic:  Normal speech and language. No gross focal neurologic deficits are appreciated.  Skin:  Skin is warm, dry and intact. No rash noted. Psychiatric: Mood and affect are normal. Speech and behavior are normal.  ____________________________________________   LABS (all labs ordered are listed, but only abnormal results are displayed)  Labs Reviewed  CBC WITH DIFFERENTIAL/PLATELET - Abnormal; Notable for the following components:      Result Value   MCHC 36.4 (*)    All other components within normal limits  COMPREHENSIVE METABOLIC PANEL - Abnormal; Notable for the following components:   Glucose, Bld 302 (*)    BUN 22 (*)    Creatinine, Ser 1.35 (*)    GFR calc non Af Amer 57 (*)    All other components within normal limits  URINALYSIS, COMPLETE (UACMP) WITH MICROSCOPIC - Abnormal; Notable for the following components:   Color, Urine YELLOW (*)    APPearance CLEAR (*)    Glucose, UA >=500 (*)    All other components within normal limits  GLUCOSE, CAPILLARY - Abnormal; Notable for the following components:   Glucose-Capillary 291 (*)    All other components within normal limits  GLUCOSE, CAPILLARY - Abnormal; Notable for the following components:   Glucose-Capillary 231 (*)    All other components  within normal limits  CBG MONITORING, ED  CBG MONITORING, ED  CBG MONITORING, ED   ____________________________________________  EKG   ____________________________________________  RADIOLOGY   ____________________________________________   PROCEDURES  Procedure(s) performed: None  Procedures  Critical Care performed: No  ____________________________________________   INITIAL IMPRESSION / ASSESSMENT AND PLAN / ED COURSE  Pertinent labs & imaging results that were available during my care of the patient were reviewed by me and considered in my medical decision making (see chart for details).   No laboratory or physical exam findings of DKA.  Patient noted hyperglycemia despite compliance with his prescribed diabetes medications.  Will hydrate, as he does feel thirsty and I would question if he may have a slight element of  dehydration.  His last echo demonstrated normal ejection fraction.  No precipitating symptoms no systemic symptoms.  Clinical Course as of Feb 28 1120  Wed Feb 28, 2020  0816 Glucose(!): 302 [MQ]  0816 CO2: 23 [MQ]  0816 Anion gap: 9 [MQ]  0816 Ketones, ur: NEGATIVE [MQ]  1028 Case, labs discussed with Dr. Ginette Pitman (PCP covering), recommends add Actos 30 mg and will follow up closely in the clinic   [MQ]    Clinical Course User Index [MQ] Delman Kitten, MD    ----------------------------------------- 11:20 AM on 02/28/2020 -----------------------------------------  Blood glucose improved, patient resting comfortably without distress or concern.  Spoke with his primary care, and they have already called him and he has an appointment set up tomorrow with his doctor for follow-up as well as being started on a prescription of Actos by his PCP team today.  Return precautions and treatment recommendations and follow-up discussed with the patient who is agreeable with the plan.  ____________________________________________   FINAL CLINICAL IMPRESSION(S)  / ED DIAGNOSES  Final diagnoses:  Hyperglycemia        Note:  This document was prepared using Dragon voice recognition software and may include unintentional dictation errors       Delman Kitten, MD 02/28/20 1121

## 2020-02-28 NOTE — Progress Notes (Addendum)
Inpatient Diabetes Program Recommendations  AACE/ADA: New Consensus Statement on Inpatient Glycemic Control   Target Ranges:  Prepandial:   less than 140 mg/dL      Peak postprandial:   less than 180 mg/dL (1-2 hours)      Critically ill patients:  140 - 180 mg/dL   Results for KAEVON, COTTA (MRN [id number]) as of 02/28/2020 10:10  Ref. Range 02/28/2020 05:07  Glucose-Capillary Latest Ref Range: 70 - 99 mg/dL 469 (H)  Results for LENARDO, WESTWOOD (MRN [id number]) as of 02/28/2020 10:10  Ref. Range 02/28/2020 05:04  Glucose Latest Ref Range: 70 - 99 mg/dL 244 (H)   Review of Glycemic Control  Diabetes history: DM2 Outpatient Diabetes medications: Amaryl 4 mg BID, Metformin 1000 mg BID, Jardiance 10 mg daily (Jardiance not on home medication list but noted in PCP notes in Care Everywhere) Current orders for Inpatient glycemic control: None; in ER at this time  Inpatient Diabetes Program Recommendations:   HgbA1C: A1C 8.6% on 02/08/20 indicating an average glucose of 200 mg/dl over the past 2-3 months. Noted telephone note on 02/09/20 that patient was to start Jardiance 10 mg daily (continue Amaryl and Metformin).  NOTE: Noted patient in ER with hyperglycemia. In reviewing chart, noted patient seen PCP 01/25/20 and per telephone note on 02/09/20 A1C was 8.6% so patient was asked to start on Jardiance 10 mg daily. Would ensure patient is taking all 3 DM medications (Metformin, Amaryl, and Jardiance) and have patient follow up with PCP regarding DM control.  Addendum 02/28/20@12 :25-Went to ER to talk with patient regarding DM medications but patient has already been discharged and per MD note patient has appointment with PCP to tomorrow.  Thanks, Orlando Penner, RN, MSN, CDE Diabetes Coordinator Inpatient Diabetes Program [phone] (Team Pager from 8am to 5pm)

## 2020-02-28 NOTE — ED Triage Notes (Signed)
Patient ambulatory to triage with steady gait, without difficulty or distress noted; pt reports elevated FSBS last several days; currently taking metformin; denies any symptoms

## 2020-02-28 NOTE — Discharge Instructions (Addendum)
Follow-up tomorrow with your doctor as scheduled already  Please continue your current diabetes medications as well as start the new prescription which her primary care office is called in for you today.  Please return to the ER if you been experiencing vomiting, weakness, feel your symptoms are worsening, have a fever, develop pain or other new concerns or symptoms arise.

## 2020-07-25 ENCOUNTER — Other Ambulatory Visit: Payer: Self-pay

## 2020-07-25 DIAGNOSIS — R519 Headache, unspecified: Secondary | ICD-10-CM | POA: Diagnosis not present

## 2020-07-25 DIAGNOSIS — Z79899 Other long term (current) drug therapy: Secondary | ICD-10-CM | POA: Diagnosis not present

## 2020-07-25 DIAGNOSIS — E119 Type 2 diabetes mellitus without complications: Secondary | ICD-10-CM | POA: Diagnosis not present

## 2020-07-25 DIAGNOSIS — Z87891 Personal history of nicotine dependence: Secondary | ICD-10-CM | POA: Diagnosis not present

## 2020-07-25 DIAGNOSIS — Z7984 Long term (current) use of oral hypoglycemic drugs: Secondary | ICD-10-CM | POA: Diagnosis not present

## 2020-07-25 DIAGNOSIS — Z7901 Long term (current) use of anticoagulants: Secondary | ICD-10-CM | POA: Insufficient documentation

## 2020-07-25 DIAGNOSIS — I10 Essential (primary) hypertension: Secondary | ICD-10-CM | POA: Insufficient documentation

## 2020-07-25 LAB — CBC
HCT: 42.7 % (ref 39.0–52.0)
Hemoglobin: 14.4 g/dL (ref 13.0–17.0)
MCH: 28.6 pg (ref 26.0–34.0)
MCHC: 33.7 g/dL (ref 30.0–36.0)
MCV: 84.9 fL (ref 80.0–100.0)
Platelets: 198 10*3/uL (ref 150–400)
RBC: 5.03 MIL/uL (ref 4.22–5.81)
RDW: 11.7 % (ref 11.5–15.5)
WBC: 3.9 10*3/uL — ABNORMAL LOW (ref 4.0–10.5)
nRBC: 0 % (ref 0.0–0.2)

## 2020-07-25 LAB — COMPREHENSIVE METABOLIC PANEL
ALT: 16 U/L (ref 0–44)
AST: 18 U/L (ref 15–41)
Albumin: 3.8 g/dL (ref 3.5–5.0)
Alkaline Phosphatase: 77 U/L (ref 38–126)
Anion gap: 7 (ref 5–15)
BUN: 19 mg/dL (ref 6–20)
CO2: 27 mmol/L (ref 22–32)
Calcium: 8.7 mg/dL — ABNORMAL LOW (ref 8.9–10.3)
Chloride: 102 mmol/L (ref 98–111)
Creatinine, Ser: 1.07 mg/dL (ref 0.61–1.24)
GFR, Estimated: >60 mL/min (ref >60)
Glucose, Bld: 221 mg/dL — ABNORMAL HIGH (ref 70–99)
Potassium: 3.5 mmol/L (ref 3.5–5.1)
Sodium: 136 mmol/L (ref 135–145)
Total Bilirubin: 0.6 mg/dL (ref 0.3–1.2)
Total Protein: 6.9 g/dL (ref 6.5–8.1)

## 2020-07-25 LAB — TROPONIN I (HIGH SENSITIVITY): Troponin I (High Sensitivity): 9 ng/L (ref <18)

## 2020-07-25 NOTE — ED Triage Notes (Signed)
Pt brought in by [facility] for co htn. States CBG 248 per EMS and bp 181/116.  States woke up this am with headache took tylenol without relief. States checked bp at home and was 228/130.

## 2020-07-26 ENCOUNTER — Emergency Department: Payer: BC Managed Care – PPO

## 2020-07-26 ENCOUNTER — Encounter: Payer: Self-pay | Admitting: Radiology

## 2020-07-26 ENCOUNTER — Emergency Department
Admission: EM | Admit: 2020-07-26 | Discharge: 2020-07-26 | Disposition: A | Discharge status: Home / Self Care | Payer: BC Managed Care – PPO | Attending: Emergency Medicine | Admitting: Emergency Medicine

## 2020-07-26 DIAGNOSIS — R519 Headache, unspecified: Secondary | ICD-10-CM

## 2020-07-26 DIAGNOSIS — I10 Essential (primary) hypertension: Secondary | ICD-10-CM

## 2020-07-26 LAB — TROPONIN I (HIGH SENSITIVITY): Troponin I (High Sensitivity): 7 ng/L (ref <18)

## 2020-07-26 MED ORDER — IOHEXOL 350 MG/ML SOLN
75.0000 mL | Freq: Once | INTRAVENOUS | Status: AC | PRN
  Administered 2020-07-26: 75 mL via INTRAVENOUS

## 2020-07-26 MED ORDER — DIPHENHYDRAMINE HCL 50 MG/ML IJ SOLN
25.0000 mg | Freq: Once | INTRAMUSCULAR | Status: AC
  Administered 2020-07-26: 25 mg via INTRAVENOUS
  Filled 2020-07-26: qty 1

## 2020-07-26 MED ORDER — METOCLOPRAMIDE HCL 5 MG/ML IJ SOLN
10.0000 mg | Freq: Once | INTRAMUSCULAR | Status: AC
  Administered 2020-07-26: 10 mg via INTRAVENOUS
  Filled 2020-07-26: qty 2

## 2020-07-26 MED ORDER — SODIUM CHLORIDE 0.9 % IV BOLUS
500.0000 mL | Freq: Once | INTRAVENOUS | Status: AC
  Administered 2020-07-26: 500 mL via INTRAVENOUS

## 2020-07-26 NOTE — ED Provider Notes (Signed)
[facility] Emergency Department Provider Note  ____________________________________________  Time seen: Approximately 1:49 AM  I have reviewed the triage vital signs and the nursing notes.   HISTORY  Chief Complaint Hypertension    HPI Earl Griffin is a 59 y.o. male with a history of atrial fibrillation on Eliquis, hypertension diabetes  who was in his usual state of health, had gone to bed, and then woke up with a headache that is moderate intensity, left parietal, nonradiating, no aggravating or alleviating factors.  No vision changes paresthesias or weakness.  No recent falls or trauma.  He has been compliant with all his medications.  Takes lisinopril and hydralazine for blood pressure and has been compliant.  Denies fevers chills or neck stiffness or pain.     Past Medical History:  Diagnosis Date  . A-fib (HCC)   . COVID-19 05/2019   Diagnosed Sept 2020, tested negative Dec 2020  . Diabetes mellitus without complication (HCC)   . GERD (gastroesophageal reflux disease)   . Hyperlipidemia   . Hypertension   . Mild left ventricular hypertrophy   . Unspecified atrial fibrillation [facility])      Patient Active Problem List   Diagnosis Date Noted  . Acute respiratory failure with hypoxia (HCC) 06/06/2019  . Chronic a-fib (HCC) 06/06/2019  . Severe hypertension 06/06/2019  . Type 2 diabetes mellitus with hyperlipidemia (HCC) 06/06/2019  . Hyperglycemia 06/06/2019  . AKI (acute kidney injury) (HCC) 06/06/2019  . Hyponatremia 06/06/2019     Past Surgical History:  Procedure Laterality Date  . NO PAST SURGERIES       Prior to Admission medications   Medication Sig Start Date End Date Taking? Authorizing Provider  apixaban (ELIQUIS) 5 MG TABS tablet Take 1 tablet (5 mg total) by mouth 2 (two) times daily. 12/02/15   Minna Antis, MD  diltiazem (CARDIZEM) 120 MG tablet Take 120 mg by mouth daily.     [provider]  glimepiride  (AMARYL) 4 MG tablet Take 1 tablet by mouth 2 (two) times daily. 01/18/18   [provider]  hydrochlorothiazide (HYDRODIURIL) 25 MG tablet Take 25 mg by mouth daily.  05/17/19   [provider]  lisinopril (ZESTRIL) 40 MG tablet Take 40 mg by mouth daily.  01/11/18   [provider]  metFORMIN (GLUCOPHAGE) 1000 MG tablet Take 1 tablet by mouth 2 (two) times daily. 12/02/17   [provider]     Allergies Patient has no known allergies.   Family History  Problem Relation Age of Onset  . Breast cancer Mother   . Hypertension Mother   . Diabetes Mother   . Heart attack Father   . Hypertension Father   . Diabetes Father     Social History Social History   Tobacco Use  . Smoking status: Former Smoker    Quit date: 05/26/2008    Years since quitting: 12.1  . Smokeless tobacco: Never Used  Vaping Use  . Vaping Use: Never used  Substance Use Topics  . Alcohol use: No  . Drug use: Never    Review of Systems  Constitutional:   No fever or chills.  ENT:   No sore throat. No rhinorrhea. Cardiovascular:   No chest pain or syncope. Respiratory:   No dyspnea or cough. Gastrointestinal:   Negative for abdominal pain, vomiting and diarrhea.  Musculoskeletal:   Negative for focal pain or swelling All other systems reviewed and are negative except as documented above in ROS and  HPI.  ____________________________________________   PHYSICAL EXAM:  VITAL SIGNS: ED Triage Vitals [07/25/20 2310]  Enc Vitals Group     BP (!) 184/110     Pulse Rate 73     Resp 20     Temp 98.2 F (36.8 C)     Temp Source Oral     SpO2 98 %     Weight 242 lb (109.8 kg)     Height 6\' 3"  (1.905 m)     Head Circumference      Peak Flow      Pain Score 8     Pain Loc      Pain Edu?      Excl. in GC?     Vital signs reviewed, nursing assessments reviewed.   Constitutional:   Alert and oriented. Non-toxic appearance. Eyes:   Conjunctivae are normal. EOMI. PERRL.   No nystagmus.  Concurrent pupillary reflex ENT      Head:   Normocephalic and atraumatic.      Nose:   Normal      Mouth/Throat: Moist mucosa      Neck:   No meningismus. Full ROM.  Nontender Hematological/Lymphatic/Immunilogical:   No cervical lymphadenopathy. Cardiovascular:   RRR. Symmetric bilateral radial and DP pulses.  No murmurs. Cap refill less than 2 seconds. Respiratory:   Normal respiratory effort without tachypnea/retractions. Breath sounds are clear and equal bilaterally. No wheezes/rales/rhonchi. Gastrointestinal:   Soft and nontender. Non distended. There is no CVA tenderness.  No rebound, rigidity, or guarding.  Musculoskeletal:   Normal range of motion in all extremities. No joint effusions.  No lower extremity tenderness.  No edema. Neurologic:   Normal speech and language.  Motor grossly intact. No acute focal neurologic deficits are appreciated.  Skin:    Skin is warm, dry and intact. No rash noted.  No petechiae, purpura, or bullae.  ____________________________________________    LABS (pertinent positives/negatives) (all labs ordered are listed, but only abnormal results are displayed) Labs Reviewed  CBC - Abnormal; Notable for the following components:      Result Value   WBC 3.9 (*)    All other components within normal limits  COMPREHENSIVE METABOLIC PANEL - Abnormal; Notable for the following components:   Glucose, Bld 221 (*)    Calcium 8.7 (*)    All other components within normal limits  TROPONIN I (HIGH SENSITIVITY)  TROPONIN I (HIGH SENSITIVITY)   ____________________________________________   EKG  Interpreted by me Normal sinus rhythm rate of 69, normal axis and intervals.  Normal QRS ST segments and T waves.  ____________________________________________    RADIOLOGY  CT Angio Head W or Wo Contrast  Result Date: 07/26/2020 CLINICAL DATA:  Sudden onset headache EXAM: CT ANGIOGRAPHY HEAD TECHNIQUE: Multidetector CT imaging of the head  was performed using the standard protocol during bolus administration of intravenous contrast. Multiplanar CT image reconstructions and MIPs were obtained to evaluate the vascular anatomy. CONTRAST:  55mL OMNIPAQUE IOHEXOL 350 MG/ML SOLN COMPARISON:  None. FINDINGS: CT HEAD Brain: There is no mass, hemorrhage or extra-axial collection. The size and configuration of the ventricles and extra-axial CSF spaces are normal. There is hypoattenuation of the white matter, most commonly indicating chronic small vessel disease. Vascular: No abnormal hyperdensity of the major intracranial arteries or dural venous sinuses. No intracranial atherosclerosis. Skull: The visualized skull base, calvarium and extracranial soft tissues are normal. Sinuses/Orbits: No fluid levels or advanced mucosal thickening of the visualized paranasal sinuses. No mastoid or middle ear  effusion. The orbits are normal. CTA HEAD POSTERIOR CIRCULATION: --Vertebral arteries: Normal --Inferior cerebellar arteries: Normal. --Basilar artery: Normal. --Superior cerebellar arteries: Normal. --Posterior cerebral arteries: Normal. ANTERIOR CIRCULATION: --Intracranial internal carotid arteries: Normal. --Anterior cerebral arteries (ACA): Normal. --Middle cerebral arteries (MCA): Normal. VENOUS SINUSES: Normal allowing for scan parameters. ANATOMIC VARIANTS: Bilateral P comms are present. IMPRESSION: 1. No intracranial arterial occlusion or high-grade stenosis. 2. Chronic small vessel disease. Electronically Signed   By: Deatra Robinson M.D.   On: 07/26/2020 02:50    ____________________________________________   PROCEDURES Procedures  ____________________________________________  DIFFERENTIAL DIAGNOSIS   Symptomatic hypertension, functional headache, cerebral aneurysm, intracranial hemorrhage  CLINICAL IMPRESSION / ASSESSMENT AND PLAN / ED COURSE  Medications ordered in the ED: Medications  metoCLOPramide (REGLAN) injection 10 mg (10 mg  Intravenous Given 07/26/20 0133)  diphenhydrAMINE (BENADRYL) injection 25 mg (25 mg Intravenous Given 07/26/20 0132)  sodium chloride 0.9 % bolus 500 mL (0 mLs Intravenous Stopped 07/26/20 0303)  iohexol (OMNIPAQUE) 350 MG/ML injection 75 mL (75 mLs Intravenous Contrast Given 07/26/20 0222)    Pertinent labs & imaging results that were available during my care of the patient were reviewed by me and considered in my medical decision making (see chart for details).  Earl Griffin was evaluated in Emergency Department on 07/26/2020 for the symptoms described in the history of present illness. He was evaluated in the context of the global COVID-19 pandemic, which necessitated consideration that the patient might be at risk for infection with the SARS-CoV-2 virus that causes COVID-19. Institutional protocols and algorithms that pertain to the evaluation of patients at risk for COVID-19 are in a state of rapid change based on information released by regulatory bodies including the CDC and federal and state organizations. These policies and algorithms were followed during the patient's care in the ED.   Patient presents with left parietal headache and uncontrolled hypertension, reportedly with blood pressure of 230/130 at home.  He notes that other times his blood pressure recently is about 140/90.  He is neurologically intact, other vital signs unremarkable, exam is reassuring.  Due to his reported severe hypertension at home, headache, and Eliquis use I will obtain a CT angiogram to evaluate for intracranial hemorrhage.  If negative I think he stable for outpatient follow-up.  Reviewed EMR, in August 2020 when he had PCP follow-up on the current medication regimen, blood pressures were adequately controlled at that time.  If repeat blood pressure measurements while in the ED are not severely elevated, will defer medication changes to PCP follow-up.  Clinical Course as of Jul 27 311  Caleen Essex Jul 26, 2020   0310 CTA negative.  Blood pressure remaining uncontrolled but not severely elevated, stable for discharge home without medication adjustment.   [PS]    Clinical Course User Index [PS] Sharman Cheek, MD    ____________________________________________   FINAL CLINICAL IMPRESSION(S) / ED DIAGNOSES    Final diagnoses:  Uncontrolled hypertension  Acute nonintractable headache, unspecified headache type     ED Discharge Orders    None      Portions of this note were generated with dragon dictation software. Dictation errors may occur despite best attempts at proofreading.   Sharman Cheek, MD 07/26/20 [phone]

## 2020-07-26 NOTE — ED Notes (Signed)
Patient transported to CT scan . 

## 2020-07-26 NOTE — Discharge Instructions (Addendum)
Your lab tests and CT scan of the brain were okay today.  Continue taking all of your medications at their current doses and follow up with your doctor within 1 week for blood pressure recheck.

## 2020-11-22 ENCOUNTER — Other Ambulatory Visit: Payer: Self-pay

## 2020-11-22 ENCOUNTER — Emergency Department
Admission: EM | Admit: 2020-11-22 | Discharge: 2020-11-23 | Disposition: A | Discharge status: Home / Self Care | Payer: BC Managed Care – PPO | Attending: Emergency Medicine | Admitting: Emergency Medicine

## 2020-11-22 ENCOUNTER — Encounter: Payer: Self-pay | Admitting: Emergency Medicine

## 2020-11-22 DIAGNOSIS — Z79899 Other long term (current) drug therapy: Secondary | ICD-10-CM | POA: Insufficient documentation

## 2020-11-22 DIAGNOSIS — R519 Headache, unspecified: Secondary | ICD-10-CM

## 2020-11-22 DIAGNOSIS — I10 Essential (primary) hypertension: Secondary | ICD-10-CM | POA: Diagnosis not present

## 2020-11-22 DIAGNOSIS — Z7901 Long term (current) use of anticoagulants: Secondary | ICD-10-CM | POA: Diagnosis not present

## 2020-11-22 DIAGNOSIS — E119 Type 2 diabetes mellitus without complications: Secondary | ICD-10-CM | POA: Insufficient documentation

## 2020-11-22 DIAGNOSIS — Z8616 Personal history of COVID-19: Secondary | ICD-10-CM | POA: Diagnosis not present

## 2020-11-22 DIAGNOSIS — I4891 Unspecified atrial fibrillation: Secondary | ICD-10-CM | POA: Diagnosis not present

## 2020-11-22 DIAGNOSIS — Z7984 Long term (current) use of oral hypoglycemic drugs: Secondary | ICD-10-CM | POA: Insufficient documentation

## 2020-11-22 DIAGNOSIS — Z87891 Personal history of nicotine dependence: Secondary | ICD-10-CM | POA: Diagnosis not present

## 2020-11-22 LAB — BASIC METABOLIC PANEL
Anion gap: 6 (ref 5–15)
BUN: 14 mg/dL (ref 6–20)
CO2: 26 mmol/L (ref 22–32)
Calcium: 9.4 mg/dL (ref 8.9–10.3)
Chloride: 105 mmol/L (ref 98–111)
Creatinine, Ser: 1.14 mg/dL (ref 0.61–1.24)
GFR, Estimated: >60 mL/min (ref >60)
Glucose, Bld: 132 mg/dL — ABNORMAL HIGH (ref 70–99)
Potassium: 3.8 mmol/L (ref 3.5–5.1)
Sodium: 137 mmol/L (ref 135–145)

## 2020-11-22 LAB — CBC
HCT: 43.8 % (ref 39.0–52.0)
Hemoglobin: 15 g/dL (ref 13.0–17.0)
MCH: 28.7 pg (ref 26.0–34.0)
MCHC: 34.2 g/dL (ref 30.0–36.0)
MCV: 83.9 fL (ref 80.0–100.0)
Platelets: 213 10*3/uL (ref 150–400)
RBC: 5.22 MIL/uL (ref 4.22–5.81)
RDW: 11.9 % (ref 11.5–15.5)
WBC: 5 10*3/uL (ref 4.0–10.5)
nRBC: 0 % (ref 0.0–0.2)

## 2020-11-22 MED ORDER — SODIUM CHLORIDE 0.9 % IV BOLUS (SEPSIS)
1000.0000 mL | Freq: Once | INTRAVENOUS | Status: AC
  Administered 2020-11-23: 1000 mL via INTRAVENOUS

## 2020-11-22 NOTE — ED Notes (Signed)
Pt took Aleve about an hr ago

## 2020-11-22 NOTE — ED Triage Notes (Signed)
Pt presents to triage room with steady gait, reports having a headache for 3 days, pt reports feels "like tension" pointed to the top of his head, denies photosensitivity, denies nausea or vomiting, denies visual changes, pt talks in complete sentences no distress noted.

## 2020-11-23 ENCOUNTER — Emergency Department: Payer: BC Managed Care – PPO

## 2020-11-23 NOTE — Discharge Instructions (Signed)
Your labs, EKG and head CT today were reassuring.  I recommend you continue your blood pressure medications as prescribed and continue to monitor your blood pressure at home and follow-up closely with your primary care doctor given your blood pressures were mildly elevated in the emergency department today.  Given you are now asymptomatic with a reassuring work-up, I feel it is reasonable to have your primary care doctor adjust your medications as an outpatient rather than this being done from the emergency department.  If you develop severe headache, vision changes or speech changes, numbness or weakness on one side of your body, chest pain or shortness of breath, vomiting that does not stop, fever of 100.4 or higher, neck pain or neck stiffness, please return to the emergency department.

## 2020-11-23 NOTE — ED Notes (Signed)
Pt returned to room from CT

## 2020-11-23 NOTE — ED Provider Notes (Signed)
[facility] Emergency Department Provider Note  ____________________________________________   Event Date/Time   First MD Initiated Contact with Patient 11/22/20 2342     (approximate)  I have reviewed the triage vital signs and the nursing notes.   HISTORY  Chief Complaint Headache    HPI Earl Griffin is a 60 y.o. male with history of atrial fibrillation on Eliquis, hypertension, hyperlipidemia, diabetes who presents to the emergency department with 3 days of diffuse tight headache.  He has taken Aleve intermittently with some relief.  No head injury.  States he did have a brief episode of tingling in the left hand after falling asleep in the waiting room.  He thinks that his arm just "fell asleep".  He was able to shake the arm and symptoms resolved.  No other numbness, tingling or focal weakness.  States his headache has improved without intervention at this time and is now 5/10 and he declines any medicine.  He states he is concerned he could be dehydrated and this is causing his headache.  He denies fevers, vomiting, diarrhea.  No sudden onset, worst headache of his life.  No vision or speech changes.  Headache is not worse with lights or sounds.  No aggravating factors.  No other alleviating factors other than with Aleve.  He is hypertensive today and is also worried that could be causing his headache.  He reports compliance with his blood pressure medications.  States he checks his blood pressure every day and is normal in the 140s/80s.  Denies history of chronic headaches, migraines.        Past Medical History:  Diagnosis Date  . A-fib (HCC)   . COVID-19 05/2019   Diagnosed Sept 2020, tested negative Dec 2020  . Diabetes mellitus without complication (HCC)   . GERD (gastroesophageal reflux disease)   . Hyperlipidemia   . Hypertension   . Mild left ventricular hypertrophy   . Unspecified atrial fibrillation [facility](HCC)     Patient Active Problem List    Diagnosis Date Noted  . Acute respiratory failure with hypoxia (HCC) 06/06/2019  . Chronic a-fib (HCC) 06/06/2019  . Severe hypertension 06/06/2019  . Type 2 diabetes mellitus with hyperlipidemia (HCC) 06/06/2019  . Hyperglycemia 06/06/2019  . AKI (acute kidney injury) (HCC) 06/06/2019  . Hyponatremia 06/06/2019    Past Surgical History:  Procedure Laterality Date  . NO PAST SURGERIES      Prior to Admission medications   Medication Sig Start Date End Date Taking? Authorizing Provider  apixaban (ELIQUIS) 5 MG TABS tablet Take 1 tablet (5 mg total) by mouth 2 (two) times daily. 12/02/15   Minna AntisPaduchowski, Kevin, MD  diltiazem (CARDIZEM) 120 MG tablet Take 120 mg by mouth daily.     [provider]  glimepiride (AMARYL) 4 MG tablet Take 1 tablet by mouth 2 (two) times daily. 01/18/18   [provider]  hydrochlorothiazide (HYDRODIURIL) 25 MG tablet Take 25 mg by mouth daily.  05/17/19   [provider]  lisinopril (ZESTRIL) 40 MG tablet Take 40 mg by mouth daily.  01/11/18   [provider]  metFORMIN (GLUCOPHAGE) 1000 MG tablet Take 1 tablet by mouth 2 (two) times daily. 12/02/17   [provider]    Allergies Patient has no known allergies.  Family History  Problem Relation Age of Onset  . Breast cancer Mother   . Hypertension Mother   . Diabetes Mother   . Heart attack Father   . Hypertension Father   .  Diabetes Father     Social History Social History   Tobacco Use  . Smoking status: Former Smoker    Quit date: 05/26/2008    Years since quitting: 12.5  . Smokeless tobacco: Never Used  Vaping Use  . Vaping Use: Never used  Substance Use Topics  . Alcohol use: No  . Drug use: Never    Review of Systems Constitutional: No fever. Eyes: No visual changes. ENT: No sore throat. Cardiovascular: Denies chest pain. Respiratory: Denies shortness of breath. Gastrointestinal: No nausea, vomiting, diarrhea. Genitourinary: Negative  for dysuria. Musculoskeletal: Negative for back pain. Skin: Negative for rash. Neurological: Negative for focal weakness or numbness.  ____________________________________________   PHYSICAL EXAM:  VITAL SIGNS: ED Triage Vitals  Enc Vitals Group     BP 11/22/20 2110 (!) 191/107     Pulse Rate 11/22/20 2110 69     Resp 11/22/20 2110 20     Temp 11/22/20 2110 98.3 F (36.8 C)     Temp Source 11/22/20 2110 Oral     SpO2 11/22/20 2110 95 %     Weight 11/22/20 2111 245 lb (111.1 kg)     Height 11/22/20 2111 6\' 3"  (1.905 m)     Head Circumference --      Peak Flow --      Pain Score 11/22/20 2111 7     Pain Loc --      Pain Edu? --      Excl. in GC? --    CONSTITUTIONAL: Alert and oriented and responds appropriately to questions. Well-appearing; well-nourished HEAD: Normocephalic EYES: Conjunctivae clear, pupils appear equal, EOM appear intact ENT: normal nose; moist mucous membranes NECK: Supple, normal ROM CARD: RRR; S1 and S2 appreciated; no murmurs, no clicks, no rubs, no gallops RESP: Normal chest excursion without splinting or tachypnea; breath sounds clear and equal bilaterally; no wheezes, no rhonchi, no rales, no hypoxia or respiratory distress, speaking full sentences ABD/GI: Normal bowel sounds; non-distended; soft, non-tender, no rebound, no guarding, no peritoneal signs, no hepatosplenomegaly BACK: The back appears normal EXT: Normal ROM in all joints; no deformity noted, no edema; no cyanosis SKIN: Normal color for age and race; warm; no rash on exposed skin NEURO: Moves all extremities equally, sensation to light touch intact diffusely, cranial nerves II through XII intact, normal speech PSYCH: The patient's mood and manner are appropriate.  ____________________________________________   LABS (all labs ordered are listed, but only abnormal results are displayed)  Labs Reviewed  BASIC METABOLIC PANEL - Abnormal; Notable for the following components:       Result Value   Glucose, Bld 132 (*)    All other components within normal limits  CBC   ____________________________________________  EKG   EKG Interpretation  Date/Time:  Friday November 22 2020 21:12:36 EST Ventricular Rate:  67 PR Interval:  170 QRS Duration: 94 QT Interval:  376 QTC Calculation: 397 R Axis:   -42 Text Interpretation: Normal sinus rhythm Left axis deviation Incomplete right bundle branch block Minimal voltage criteria for LVH, may be normal variant ( R in aVL ) Abnormal ECG No significant change since last tracing Confirmed by 03-08-2005 [phone]) on 11/23/2020 12:38:40 AM       ____________________________________________  RADIOLOGY 01/23/2021 Ward, personally viewed and evaluated these images (plain radiographs) as part of my medical decision making, as well as reviewing the written report by the radiologist.  ED MD interpretation:  No ICH seen on CT head.  Official radiology report(s): CT  Head Wo Contrast  Result Date: 11/23/2020 CLINICAL DATA:  Headache X 3 days. On Eliquis. intracranial hemorrhage suspected EXAM: CT HEAD WITHOUT CONTRAST TECHNIQUE: Contiguous axial images were obtained from the base of the skull through the vertex without intravenous contrast. COMPARISON:  CT head 10/19/2019 FINDINGS: Brain: Patchy and confluent areas of decreased attenuation are noted throughout the deep and periventricular white matter of the cerebral hemispheres bilaterally, compatible with chronic microvascular ischemic disease. No evidence of large-territorial acute infarction. No parenchymal hemorrhage. No mass lesion. No extra-axial collection. No mass effect or midline shift. No hydrocephalus. Basilar cisterns are patent. Vascular: No hyperdense vessel. Atherosclerotic calcifications are present within the cavernous internal carotid and vertebral arteries. Skull: No acute fracture or focal lesion. Sinuses/Orbits: Paranasal sinuses and mastoid air cells are clear. The  orbits are unremarkable. Other: None. IMPRESSION: No acute intracranial abnormality. Electronically Signed   By: Tish Frederickson M.D.   On: 11/23/2020 00:18    ____________________________________________   PROCEDURES  Procedure(s) performed (including Critical Care):  Procedures  ____________________________________________   INITIAL IMPRESSION / ASSESSMENT AND PLAN / ED COURSE  As part of my medical decision making, I reviewed the following data within the electronic MEDICAL RECORD NUMBER Nursing notes reviewed and incorporated, Labs reviewed , EKG interpreted , Old EKG reviewed, Old chart reviewed, CT head reviewed and Notes from prior ED visits         Patient here with complaints of headache.  Seems atypical for aneurysm rupture, subarachnoid but he is on Eliquis and denies previous history of headaches.  He is slightly hypertensive here which could be secondary to pain.  He declines any pain medication but states he feels dehydrated.  Will give a liter of IV fluids to see if this helps with his symptoms.  Will obtain head CT.  Labs, EKG obtained from triage unremarkable.  He has no focal neurologic deficits.  Doubt CVA, TIA.  Brief episode of numbness in his left hand in the waiting room seems like it is peripheral in nature, possible neuropraxia, from falling asleep on that left side.  ED PROGRESS  Patient's head CT shows no acute abnormality.  Patient reports headache has completely resolved after IV fluids.  He is still very mildly hypertensive.  Given he is asymptomatic, recommended follow-up with his primary care doctor for further evaluation and management of his blood pressure.  He is comfortable with this plan.  Discussed return precautions. At this time, I do not feel there is any life-threatening condition present. I have reviewed, interpreted and discussed all results (EKG, imaging, lab, urine as appropriate) and exam findings with patient/family. I have reviewed nursing notes  and appropriate previous records.  I feel the patient is safe to be discharged home without further emergent workup and can continue workup as an outpatient as needed. Discussed usual and customary return precautions. Patient/family verbalize understanding and are comfortable with this plan.  Outpatient follow-up has been provided as needed. All questions have been answered.  ____________________________________________   FINAL CLINICAL IMPRESSION(S) / ED DIAGNOSES  Final diagnoses:  Generalized headache  Hypertension, unspecified type     ED Discharge Orders    None      *Please note:  Abrahm Mancia was evaluated in Emergency Department on 11/23/2020 for the symptoms described in the history of present illness. He was evaluated in the context of the global COVID-19 pandemic, which necessitated consideration that the patient might be at risk for infection with the SARS-CoV-2 virus that causes COVID-19. Institutional  protocols and algorithms that pertain to the evaluation of patients at risk for COVID-19 are in a state of rapid change based on information released by regulatory bodies including the CDC and federal and state organizations. These policies and algorithms were followed during the patient's care in the ED.  Some ED evaluations and interventions may be delayed as a result of limited staffing during and the pandemic.*   Note:  This document was prepared using Dragon voice recognition software and may include unintentional dictation errors.   Ward, Layla Maw, DO 11/23/20 0122

## 2021-04-10 ENCOUNTER — Other Ambulatory Visit: Payer: Self-pay

## 2021-04-10 ENCOUNTER — Emergency Department: Payer: BC Managed Care – PPO

## 2021-04-10 ENCOUNTER — Emergency Department
Admission: EM | Admit: 2021-04-10 | Discharge: 2021-04-10 | Disposition: A | Discharge status: Home / Self Care | Payer: BC Managed Care – PPO | Attending: Emergency Medicine | Admitting: Emergency Medicine

## 2021-04-10 ENCOUNTER — Encounter: Payer: Self-pay | Admitting: Emergency Medicine

## 2021-04-10 DIAGNOSIS — E1169 Type 2 diabetes mellitus with other specified complication: Secondary | ICD-10-CM | POA: Diagnosis not present

## 2021-04-10 DIAGNOSIS — E785 Hyperlipidemia, unspecified: Secondary | ICD-10-CM | POA: Insufficient documentation

## 2021-04-10 DIAGNOSIS — I482 Chronic atrial fibrillation, unspecified: Secondary | ICD-10-CM | POA: Diagnosis not present

## 2021-04-10 DIAGNOSIS — Z7984 Long term (current) use of oral hypoglycemic drugs: Secondary | ICD-10-CM | POA: Diagnosis not present

## 2021-04-10 DIAGNOSIS — Z87891 Personal history of nicotine dependence: Secondary | ICD-10-CM | POA: Diagnosis not present

## 2021-04-10 DIAGNOSIS — I10 Essential (primary) hypertension: Secondary | ICD-10-CM | POA: Diagnosis not present

## 2021-04-10 DIAGNOSIS — Z7901 Long term (current) use of anticoagulants: Secondary | ICD-10-CM | POA: Diagnosis not present

## 2021-04-10 DIAGNOSIS — R072 Precordial pain: Secondary | ICD-10-CM | POA: Diagnosis present

## 2021-04-10 DIAGNOSIS — Z79899 Other long term (current) drug therapy: Secondary | ICD-10-CM | POA: Diagnosis not present

## 2021-04-10 DIAGNOSIS — Z8616 Personal history of COVID-19: Secondary | ICD-10-CM | POA: Insufficient documentation

## 2021-04-10 DIAGNOSIS — R0789 Other chest pain: Secondary | ICD-10-CM

## 2021-04-10 LAB — CBC
HCT: 44.4 % (ref 39.0–52.0)
Hemoglobin: 15.2 g/dL (ref 13.0–17.0)
MCH: 29.1 pg (ref 26.0–34.0)
MCHC: 34.2 g/dL (ref 30.0–36.0)
MCV: 85.1 fL (ref 80.0–100.0)
Platelets: 205 10*3/uL (ref 150–400)
RBC: 5.22 MIL/uL (ref 4.22–5.81)
RDW: 11.9 % (ref 11.5–15.5)
WBC: 5.4 10*3/uL (ref 4.0–10.5)
nRBC: 0 % (ref 0.0–0.2)

## 2021-04-10 LAB — BASIC METABOLIC PANEL
Anion gap: 5 (ref 5–15)
BUN: 20 mg/dL (ref 6–20)
CO2: 27 mmol/L (ref 22–32)
Calcium: 9.4 mg/dL (ref 8.9–10.3)
Chloride: 108 mmol/L (ref 98–111)
Creatinine, Ser: 1.28 mg/dL — ABNORMAL HIGH (ref 0.61–1.24)
GFR, Estimated: >60 mL/min (ref >60)
Glucose, Bld: 230 mg/dL — ABNORMAL HIGH (ref 70–99)
Potassium: 4 mmol/L (ref 3.5–5.1)
Sodium: 140 mmol/L (ref 135–145)

## 2021-04-10 LAB — TROPONIN I (HIGH SENSITIVITY)
Troponin I (High Sensitivity): 11 ng/L (ref <18)
Troponin I (High Sensitivity): 8 ng/L (ref <18)

## 2021-04-10 NOTE — Discharge Instructions (Addendum)
Turn to the ER for new, worsening, or persistent severe chest pain, difficulty breathing, weakness or lightheadedness, or any other new or worsening symptoms that concern you.  Follow-up with your regular doctor.

## 2021-04-10 NOTE — ED Provider Notes (Signed)
[facility] Emergency Department Provider Note ____________________________________________   Event Date/Time   First MD Initiated Contact with Patient 04/10/21 0730     (approximate)  I have reviewed the triage vital signs and the nursing notes.   HISTORY  Chief Complaint Chest Pain    HPI Earl Griffin is a 60 y.o. male with PMH as noted below including atrial fibrillation who presents with substernal chest pain, cute onset yesterday evening, described as squeezing, lasting about 2 hours, now resolved.  He states it started after he drank a glass of cold water.  He states that this has happened before when he drinks overly cold water although he has not had an episode as severe as this.  He denies any significant associated shortness of breath, nausea or vomiting, but states he did feel slightly lightheaded.  He has no leg pain or swelling.  He denies any symptoms currently and states he feels fine.  Past Medical History:  Diagnosis Date   A-fib [facility])    COVID-19 05/2019   Diagnosed Sept 2020, tested negative Dec 2020   Diabetes mellitus without complication (HCC)    GERD (gastroesophageal reflux disease)    Hyperlipidemia    Hypertension    Mild left ventricular hypertrophy    Unspecified atrial fibrillation [facility])     Patient Active Problem List   Diagnosis Date Noted   Acute respiratory failure with hypoxia (HCC) 06/06/2019   Chronic a-fib (HCC) 06/06/2019   Severe hypertension 06/06/2019   Type 2 diabetes mellitus with hyperlipidemia (HCC) 06/06/2019   Hyperglycemia 06/06/2019   AKI (acute kidney injury) (HCC) 06/06/2019   Hyponatremia 06/06/2019    Past Surgical History:  Procedure Laterality Date   NO PAST SURGERIES      Prior to Admission medications   Medication Sig Start Date End Date Taking? Authorizing Provider  apixaban (ELIQUIS) 5 MG TABS tablet Take 1 tablet (5 mg total) by mouth 2 (two) times daily. 12/02/15   Minna Antis, MD  diltiazem (CARDIZEM) 120 MG tablet Take 120 mg by mouth daily.     [provider]  glimepiride (AMARYL) 4 MG tablet Take 1 tablet by mouth 2 (two) times daily. 01/18/18   [provider]  hydrochlorothiazide (HYDRODIURIL) 25 MG tablet Take 25 mg by mouth daily.  05/17/19   [provider]  lisinopril (ZESTRIL) 40 MG tablet Take 40 mg by mouth daily.  01/11/18   [provider]  metFORMIN (GLUCOPHAGE) 1000 MG tablet Take 1 tablet by mouth 2 (two) times daily. 12/02/17   [provider]    Allergies Patient has no known allergies.  Family History  Problem Relation Age of Onset   Breast cancer Mother    Hypertension Mother    Diabetes Mother    Heart attack Father    Hypertension Father    Diabetes Father     Social History Social History   Tobacco Use   Smoking status: Former    Types: Cigarettes    Quit date: 05/26/2008    Years since quitting: 12.8   Smokeless tobacco: Never  Vaping Use   Vaping Use: Never used  Substance Use Topics   Alcohol use: No   Drug use: Never    Review of Systems  Constitutional: No fever/chills Eyes: No visual changes. ENT: No sore throat. Cardiovascular: Positive for resolved chest pain. Respiratory: Denies shortness of breath. Gastrointestinal: No vomiting or diarrhea.  Genitourinary: Negative for dysuria.  Musculoskeletal: Negative for back pain.  Skin: Negative for rash. Neurological: Negative for headaches, focal weakness or numbness.   ____________________________________________   PHYSICAL EXAM:  VITAL SIGNS: ED Triage Vitals  Enc Vitals Group     BP 04/10/21 0459 (!) 163/100     Pulse Rate 04/10/21 0459 (!) 108     Resp 04/10/21 0459 18     Temp 04/10/21 0459 98 F (36.7 C)     Temp Source 04/10/21 0459 Oral     SpO2 04/10/21 0459 98 %     Weight 04/10/21 0458 240 lb (108.9 kg)     Height 04/10/21 0458 6\' 3"  (1.905 m)     Head Circumference --      Peak Flow --       Pain Score 04/10/21 0457 6     Pain Loc --      Pain Edu? --      Excl. in GC? --     Constitutional: Alert and oriented. Well appearing and in no acute distress. Eyes: Conjunctivae are normal.  Head: Atraumatic. Nose: No congestion/rhinnorhea. Mouth/Throat: Mucous membranes are moist.   Neck: Normal range of motion.  Cardiovascular: Normal rate, regular rhythm. Grossly normal heart sounds.  Good peripheral circulation. Respiratory: Normal respiratory effort.  No retractions. Lungs CTAB. Gastrointestinal: No distention.  Musculoskeletal: No lower extremity edema.  Extremities warm and well perfused.  Neurologic:  Normal speech and language. No gross focal neurologic deficits are appreciated.  Skin:  Skin is warm and dry. No rash noted. Psychiatric: Mood and affect are normal. Speech and behavior are normal.  ____________________________________________   LABS (all labs ordered are listed, but only abnormal results are displayed)  Labs Reviewed  BASIC METABOLIC PANEL - Abnormal; Notable for the following components:      Result Value   Glucose, Bld 230 (*)    Creatinine, Ser 1.28 (*)    All other components within normal limits  CBC  TROPONIN I (HIGH SENSITIVITY)  TROPONIN I (HIGH SENSITIVITY)   ____________________________________________  EKG  ED ECG REPORT I, 04/12/21, the attending physician, personally viewed and interpreted this ECG.  Date: 04/10/2021 EKG Time: 0502 Rate: 97 Rhythm: Atrial fibrillation QRS Axis: Left axis Intervals: RBBB ST/T Wave abnormalities: normal Narrative Interpretation: no evidence of acute ischemia  ____________________________________________  RADIOLOGY  Chest x-ray interpreted by me shows no focal consolidation or edema  ____________________________________________   PROCEDURES  Procedure(s) performed: No  Procedures  Critical Care performed: No ____________________________________________   INITIAL  IMPRESSION / ASSESSMENT AND PLAN / ED COURSE  Pertinent labs & imaging results that were available during my care of the patient were reviewed by me and considered in my medical decision making (see chart for details).   60 year old male with a history of atrial fibrillation and other PMH as noted above presents with atypical chest pain for about 2 hours last night after drinking cold water, which has since resolved.  He has had this before when drinking cold water but not as severe as last night.  I reviewed the past medical records in Epic and confirmed the history of atrial fibrillation, however he has no prior history of CAD.  On exam the patient is overall well-appearing.  His vital signs are normal.  The physical exam is unremarkable.  He is currently asymptomatic.  EKG is nonischemic.  Initial basic labs and troponin are normal.  Overall presentation is consistent with GERD, musculoskeletal pain, or other benign etiology.  I have a low suspicion for ACS.  There is  no clinical evidence for PE, aortic dissection or other vascular etiology, or other concerning acute cause.  Chest x-ray is normal.  We will obtain a second troponin; if this is negative and the patient remains asymptomatic I anticipate discharge home.  ----------------------------------------- 9:12 AM on 04/10/2021 -----------------------------------------  Repeat troponin is negative and the patient remains pain-free.  He is stable for discharge home.  I counseled him on the results of the work-up.  Return precautions given, and he expresses understanding.  ____________________________________________   FINAL CLINICAL IMPRESSION(S) / ED DIAGNOSES  Final diagnoses:  Atypical chest pain      NEW MEDICATIONS STARTED DURING THIS VISIT:  Discharge Medication List as of 04/10/2021  8:45 AM       Note:  This document was prepared using Dragon voice recognition software and may include unintentional dictation errors.     Dionne Bucy, MD 04/10/21 [phone]

## 2021-04-10 NOTE — ED Triage Notes (Signed)
Patient ambulatory to triage with steady gait, without difficulty or distress noted; pt reports upper CP with left arm numbness since yesterday; st hx afib

## 2021-05-23 ENCOUNTER — Emergency Department: Payer: BC Managed Care – PPO

## 2021-05-23 ENCOUNTER — Other Ambulatory Visit: Payer: Self-pay

## 2021-05-23 ENCOUNTER — Emergency Department
Admission: EM | Admit: 2021-05-23 | Discharge: 2021-05-23 | Disposition: A | Discharge status: Home / Self Care | Payer: BC Managed Care – PPO | Attending: Emergency Medicine | Admitting: Emergency Medicine

## 2021-05-23 ENCOUNTER — Encounter: Payer: Self-pay | Admitting: Emergency Medicine

## 2021-05-23 DIAGNOSIS — Z7984 Long term (current) use of oral hypoglycemic drugs: Secondary | ICD-10-CM | POA: Diagnosis not present

## 2021-05-23 DIAGNOSIS — I10 Essential (primary) hypertension: Secondary | ICD-10-CM | POA: Insufficient documentation

## 2021-05-23 DIAGNOSIS — Z8616 Personal history of COVID-19: Secondary | ICD-10-CM | POA: Diagnosis not present

## 2021-05-23 DIAGNOSIS — Z79899 Other long term (current) drug therapy: Secondary | ICD-10-CM | POA: Diagnosis not present

## 2021-05-23 DIAGNOSIS — I480 Paroxysmal atrial fibrillation: Secondary | ICD-10-CM | POA: Diagnosis not present

## 2021-05-23 DIAGNOSIS — E119 Type 2 diabetes mellitus without complications: Secondary | ICD-10-CM | POA: Insufficient documentation

## 2021-05-23 DIAGNOSIS — Z7901 Long term (current) use of anticoagulants: Secondary | ICD-10-CM | POA: Insufficient documentation

## 2021-05-23 DIAGNOSIS — Z87891 Personal history of nicotine dependence: Secondary | ICD-10-CM | POA: Diagnosis not present

## 2021-05-23 DIAGNOSIS — R002 Palpitations: Secondary | ICD-10-CM | POA: Diagnosis present

## 2021-05-23 DIAGNOSIS — R079 Chest pain, unspecified: Secondary | ICD-10-CM

## 2021-05-23 LAB — TROPONIN I (HIGH SENSITIVITY)
Troponin I (High Sensitivity): 9 ng/L (ref <18)
Troponin I (High Sensitivity): 9 ng/L (ref <18)

## 2021-05-23 LAB — CBC
HCT: 45.8 % (ref 39.0–52.0)
Hemoglobin: 15.1 g/dL (ref 13.0–17.0)
MCH: 27.8 pg (ref 26.0–34.0)
MCHC: 33 g/dL (ref 30.0–36.0)
MCV: 84.2 fL (ref 80.0–100.0)
Platelets: 240 10*3/uL (ref 150–400)
RBC: 5.44 MIL/uL (ref 4.22–5.81)
RDW: 12.3 % (ref 11.5–15.5)
WBC: 5 10*3/uL (ref 4.0–10.5)
nRBC: 0 % (ref 0.0–0.2)

## 2021-05-23 LAB — BASIC METABOLIC PANEL
Anion gap: 8 (ref 5–15)
BUN: 16 mg/dL (ref 6–20)
CO2: 24 mmol/L (ref 22–32)
Calcium: 9.2 mg/dL (ref 8.9–10.3)
Chloride: 106 mmol/L (ref 98–111)
Creatinine, Ser: 1.3 mg/dL — ABNORMAL HIGH (ref 0.61–1.24)
GFR, Estimated: >60 mL/min (ref >60)
Glucose, Bld: 220 mg/dL — ABNORMAL HIGH (ref 70–99)
Potassium: 4.1 mmol/L (ref 3.5–5.1)
Sodium: 138 mmol/L (ref 135–145)

## 2021-05-23 LAB — MAGNESIUM: Magnesium: 1.9 mg/dL (ref 1.7–2.4)

## 2021-05-23 MED ORDER — APIXABAN 5 MG PO TABS
5.0000 mg | ORAL_TABLET | Freq: Once | ORAL | Status: AC
  Administered 2021-05-23: 5 mg via ORAL
  Filled 2021-05-23: qty 1

## 2021-05-23 MED ORDER — DILTIAZEM HCL ER COATED BEADS 120 MG PO CP24
120.0000 mg | ORAL_CAPSULE | Freq: Once | ORAL | Status: AC
  Administered 2021-05-23: 120 mg via ORAL
  Filled 2021-05-23: qty 1

## 2021-05-23 NOTE — ED Provider Notes (Addendum)
[facility] Emergency Department Provider Note  ____________________________________________   Event Date/Time   First MD Initiated Contact with Patient 05/23/21 [phone]     (approximate)  I have reviewed the triage vital signs and the nursing notes.   HISTORY  Chief Complaint Palpitations    HPI Rawlin Reaume is a 60 y.o. male history of atrial fibrillation on Eliquis, hypertension, hyperlipidemia who presents to the emergency department with palpitations, chest tightness, shortness of breath, dizziness, diaphoresis that started yesterday.  No nausea or vomiting.  No fever or cough.  Reports he is not normally in A. fib.  He has been compliant with his medications but has not had his Eliquis or diltiazem this morning yet.  States he has never been cardioverted before.        Past Medical History:  Diagnosis Date   A-fib [facility])    COVID-19 05/2019   Diagnosed Sept 2020, tested negative Dec 2020   Diabetes mellitus without complication (HCC)    GERD (gastroesophageal reflux disease)    Hyperlipidemia    Hypertension    Mild left ventricular hypertrophy    Unspecified atrial fibrillation [facility])     Patient Active Problem List   Diagnosis Date Noted   Acute respiratory failure with hypoxia (HCC) 06/06/2019   Chronic a-fib (HCC) 06/06/2019   Severe hypertension 06/06/2019   Type 2 diabetes mellitus with hyperlipidemia (HCC) 06/06/2019   Hyperglycemia 06/06/2019   AKI (acute kidney injury) (HCC) 06/06/2019   Hyponatremia 06/06/2019    Past Surgical History:  Procedure Laterality Date   NO PAST SURGERIES      Prior to Admission medications   Medication Sig Start Date End Date Taking? Authorizing Provider  apixaban (ELIQUIS) 5 MG TABS tablet Take 1 tablet (5 mg total) by mouth 2 (two) times daily. 12/02/15   Minna Antis, MD  diltiazem (CARDIZEM) 120 MG tablet Take 120 mg by mouth daily.     [provider]  glimepiride (AMARYL) 4  MG tablet Take 1 tablet by mouth 2 (two) times daily. 01/18/18   [provider]  hydrochlorothiazide (HYDRODIURIL) 25 MG tablet Take 25 mg by mouth daily.  05/17/19   [provider]  lisinopril (ZESTRIL) 40 MG tablet Take 40 mg by mouth daily.  01/11/18   [provider]  metFORMIN (GLUCOPHAGE) 1000 MG tablet Take 1 tablet by mouth 2 (two) times daily. 12/02/17   [provider]    Allergies Patient has no known allergies.  Family History  Problem Relation Age of Onset   Breast cancer Mother    Hypertension Mother    Diabetes Mother    Heart attack Father    Hypertension Father    Diabetes Father     Social History Social History   Tobacco Use   Smoking status: Former    Types: Cigarettes    Quit date: 05/26/2008    Years since quitting: 13.0   Smokeless tobacco: Never  Vaping Use   Vaping Use: Never used  Substance Use Topics   Alcohol use: No   Drug use: Never    Review of Systems Constitutional: No fever. Eyes: No visual changes. ENT: No sore throat. Cardiovascular: + chest pain. Respiratory: + shortness of breath. Gastrointestinal: No nausea, vomiting, diarrhea. Genitourinary: Negative for dysuria. Musculoskeletal: Negative for back pain. Skin: Negative for rash. Neurological: Negative for focal weakness or numbness.  ____________________________________________   PHYSICAL EXAM:  VITAL SIGNS: ED Triage Vitals  Enc Vitals Group  BP 05/23/21 0535 (!) 171/118     Pulse Rate 05/23/21 0535 (!) 102     Resp 05/23/21 0534 18     Temp 05/23/21 0535 97.9 F (36.6 C)     Temp Source 05/23/21 0534 Oral     SpO2 05/23/21 0535 97 %     Weight 05/23/21 0527 245 lb (111.1 kg)     Height 05/23/21 0527 6\' 4"  (1.93 m)     Head Circumference --      Peak Flow --      Pain Score 05/23/21 0527 0     Pain Loc --      Pain Edu? --      Excl. in GC? --    CONSTITUTIONAL: Alert and oriented and responds appropriately to questions.  Well-appearing; well-nourished HEAD: Normocephalic EYES: Conjunctivae clear, pupils appear equal, EOM appear intact ENT: normal nose; moist mucous membranes NECK: Supple, normal ROM CARD: Irregularly irregular and rate controlled; S1 and S2 appreciated; no murmurs, no clicks, no rubs, no gallops RESP: Normal chest excursion without splinting or tachypnea; breath sounds clear and equal bilaterally; no wheezes, no rhonchi, no rales, no hypoxia or respiratory distress, speaking full sentences ABD/GI: Normal bowel sounds; non-distended; soft, non-tender, no rebound, no guarding, no peritoneal signs, no hepatosplenomegaly BACK: The back appears normal EXT: Normal ROM in all joints; no deformity noted, no edema; no cyanosis; no calf tenderness or calf swelling SKIN: Normal color for age and race; warm; no rash on exposed skin NEURO: Moves all extremities equally PSYCH: The patient's mood and manner are appropriate.  ____________________________________________   LABS (all labs ordered are listed, but only abnormal results are displayed)  Labs Reviewed  BASIC METABOLIC PANEL - Abnormal; Notable for the following components:      Result Value   Glucose, Bld 220 (*)    Creatinine, Ser 1.30 (*)    All other components within normal limits  CBC  MAGNESIUM  TROPONIN I (HIGH SENSITIVITY)   ____________________________________________  EKG   EKG Interpretation  Date/Time:  Friday May 23 2021 05:26:39 EDT Ventricular Rate:  93 PR Interval:    QRS Duration: 84 QT Interval:  340 QTC Calculation: 422 R Axis:   -42 Text Interpretation: Atrial fibrillation with premature ventricular or aberrantly conducted complexes Left axis deviation Abnormal ECG No significant change since last tracing Confirmed by [id number] [phone]) on 05/23/2021 5:34:02 AM        ____________________________________________  RADIOLOGY 07/23/2021 Joffrey Kerce, personally viewed and evaluated these images (plain  radiographs) as part of my medical decision making, as well as reviewing the written report by the radiologist.  ED MD interpretation: Chest x-ray clear.  Official radiology report(s): DG Chest 2 View  Result Date: 05/23/2021 CLINICAL DATA:  60 year old male with palpitations and chest tightness since yesterday. History of atrial fibrillation EXAM: CHEST - 2 VIEW COMPARISON:  Chest radiographs 04/10/2021 and earlier. FINDINGS: Stable lung volumes and mediastinal contours, heart size remains within normal limits. Visualized tracheal air column is within normal limits. Lung volumes are stable and within normal limits. No pneumothorax, pulmonary edema, pleural effusion or confluent pulmonary opacity. Negative visible bowel gas pattern. No acute osseous abnormality identified. IMPRESSION: No acute cardiopulmonary abnormality. Electronically Signed   By: 04/12/2021 M.D.   On: 05/23/2021 06:16    ____________________________________________   PROCEDURES  Procedure(s) performed (including Critical Care):  .1-3 Lead EKG Interpretation Performed by: Nitasha Jewel, 07/23/2021, DO Authorized by: Durand Wittmeyer, Layla Maw, DO  Interpretation: abnormal     ECG rate:  84   ECG rate assessment: normal     Rhythm: atrial fibrillation     Ectopy: none     Conduction: normal     ____________________________________________   INITIAL IMPRESSION / ASSESSMENT AND PLAN / ED COURSE  As part of my medical decision making, I reviewed the following data within the electronic MEDICAL RECORD NUMBER Nursing notes reviewed and incorporated, Labs reviewed , EKG interpreted , Old EKG reviewed, Patient signed out to oncoming ED physician, Radiograph reviewed , and Notes from prior ED visits         Patient here with symptomatic atrial fibrillation.  He is having chest tightness and shortness of breath.  EKG is nonischemic.  He is currently rate controlled.  We will check cardiac labs, electrolytes.  He does have risk factors for ACS.   Doubt PE, dissection.  No infectious symptoms to suggest pneumonia.  Does not appear volume overloaded.  Patient is hemodynamically stable and not in distress.  I do not feel that he is a candidate at this time for emergent cardioversion.  We will give him his home medications.  ED PROGRESS  Patient's labs show chronic kidney disease which is stable, minimally elevated glucose without DKA.  Normal hemoglobin, electrolytes.  First troponin negative.  Chest x-ray clear.  On reevaluation, patient is still in atrial fibrillation with a rate in the 80s but states he is now feeling much better and no longer having any symptoms.  Plan is to obtain second troponin and if negative will discharge home.  He is comfortable with this plan.  Signed out to oncoming ED physician to follow-up on repeat troponin.  I reviewed all nursing notes and pertinent previous records as available.  I have reviewed and interpreted any EKGs, lab and urine results, imaging (as available).  ____________________________________________   FINAL CLINICAL IMPRESSION(S) / ED DIAGNOSES  Final diagnoses:  Paroxysmal atrial fibrillation (HCC)  Chest pain, unspecified type     ED Discharge Orders     None       *Please note:  Earl Griffin was evaluated in Emergency Department on 05/23/2021 for the symptoms described in the history of present illness. He was evaluated in the context of the global COVID-19 pandemic, which necessitated consideration that the patient might be at risk for infection with the SARS-CoV-2 virus that causes COVID-19. Institutional protocols and algorithms that pertain to the evaluation of patients at risk for COVID-19 are in a state of rapid change based on information released by regulatory bodies including the CDC and federal and state organizations. These policies and algorithms were followed during the patient's care in the ED.  Some ED evaluations and interventions may be delayed as a result of limited  staffing during and the pandemic.*   Note:  This document was prepared using Dragon voice recognition software and may include unintentional dictation errors.    Adley Mazurowski, Layla Maw, DO 05/23/21 0658    Tony Granquist, Layla Maw, DO 05/23/21 [phone]

## 2021-05-23 NOTE — ED Notes (Signed)
Lab to add trop & mag to existing blood.

## 2021-05-23 NOTE — ED Triage Notes (Signed)
Pt states tightness and palpitations that began yesterday, denies cp. Hx afib controlled by meds. States mild SHOB, "my heart feels like it's beating irregular." NAD.

## 2021-05-23 NOTE — ED Notes (Signed)
Report given to oncoming RN all questions answered

## 2021-05-23 NOTE — Discharge Instructions (Addendum)
Up withPlease continue all of your medications.  We gave you your morning doses of Eliquis and diltiazem while here in the emergency department.  Please follow-up with your PCP.  I have also attached the phone number for Dr. Juliann Pares, one of our cardiologist, to see in the clinic.  He develop any further worsening symptoms, please return.

## 2021-06-20 ENCOUNTER — Other Ambulatory Visit: Payer: Self-pay

## 2021-06-20 ENCOUNTER — Encounter: Payer: Self-pay | Admitting: Emergency Medicine

## 2021-06-20 ENCOUNTER — Emergency Department: Payer: BC Managed Care – PPO

## 2021-06-20 ENCOUNTER — Emergency Department
Admission: EM | Admit: 2021-06-20 | Discharge: 2021-06-20 | Disposition: A | Discharge status: Home / Self Care | Payer: BC Managed Care – PPO | Attending: Emergency Medicine | Admitting: Emergency Medicine

## 2021-06-20 DIAGNOSIS — Z79899 Other long term (current) drug therapy: Secondary | ICD-10-CM | POA: Diagnosis not present

## 2021-06-20 DIAGNOSIS — Z87891 Personal history of nicotine dependence: Secondary | ICD-10-CM | POA: Insufficient documentation

## 2021-06-20 DIAGNOSIS — R739 Hyperglycemia, unspecified: Secondary | ICD-10-CM

## 2021-06-20 DIAGNOSIS — Z7901 Long term (current) use of anticoagulants: Secondary | ICD-10-CM | POA: Diagnosis not present

## 2021-06-20 DIAGNOSIS — R002 Palpitations: Secondary | ICD-10-CM

## 2021-06-20 DIAGNOSIS — Z7984 Long term (current) use of oral hypoglycemic drugs: Secondary | ICD-10-CM | POA: Insufficient documentation

## 2021-06-20 DIAGNOSIS — I4891 Unspecified atrial fibrillation: Secondary | ICD-10-CM | POA: Diagnosis not present

## 2021-06-20 DIAGNOSIS — E1165 Type 2 diabetes mellitus with hyperglycemia: Secondary | ICD-10-CM | POA: Insufficient documentation

## 2021-06-20 DIAGNOSIS — I10 Essential (primary) hypertension: Secondary | ICD-10-CM | POA: Insufficient documentation

## 2021-06-20 DIAGNOSIS — Z8616 Personal history of COVID-19: Secondary | ICD-10-CM | POA: Diagnosis not present

## 2021-06-20 LAB — PROTIME-INR
INR: 1.1 (ref 0.8–1.2)
Prothrombin Time: 14.4 seconds (ref 11.4–15.2)

## 2021-06-20 LAB — BASIC METABOLIC PANEL
Anion gap: 9 (ref 5–15)
BUN: 16 mg/dL (ref 6–20)
CO2: 25 mmol/L (ref 22–32)
Calcium: 9.4 mg/dL (ref 8.9–10.3)
Chloride: 102 mmol/L (ref 98–111)
Creatinine, Ser: 1.34 mg/dL — ABNORMAL HIGH (ref 0.61–1.24)
GFR, Estimated: >60 mL/min (ref >60)
Glucose, Bld: 382 mg/dL — ABNORMAL HIGH (ref 70–99)
Potassium: 4.1 mmol/L (ref 3.5–5.1)
Sodium: 136 mmol/L (ref 135–145)

## 2021-06-20 LAB — CBC
HCT: 41.9 % (ref 39.0–52.0)
Hemoglobin: 14.8 g/dL (ref 13.0–17.0)
MCH: 29.6 pg (ref 26.0–34.0)
MCHC: 35.3 g/dL (ref 30.0–36.0)
MCV: 83.8 fL (ref 80.0–100.0)
Platelets: 214 10*3/uL (ref 150–400)
RBC: 5 MIL/uL (ref 4.22–5.81)
RDW: 12 % (ref 11.5–15.5)
WBC: 5.2 10*3/uL (ref 4.0–10.5)
nRBC: 0 % (ref 0.0–0.2)

## 2021-06-20 LAB — T4, FREE: Free T4: 0.74 ng/dL (ref 0.61–1.12)

## 2021-06-20 LAB — TSH: TSH: 2.105 u[IU]/mL (ref 0.350–4.500)

## 2021-06-20 LAB — TROPONIN I (HIGH SENSITIVITY)
Troponin I (High Sensitivity): 15 ng/L (ref <18)
Troponin I (High Sensitivity): 14 ng/L (ref <18)

## 2021-06-20 LAB — MAGNESIUM: Magnesium: 1.9 mg/dL (ref 1.7–2.4)

## 2021-06-20 MED ORDER — DILTIAZEM HCL 60 MG PO TABS
120.0000 mg | ORAL_TABLET | Freq: Once | ORAL | Status: AC
  Administered 2021-06-20: 120 mg via ORAL
  Filled 2021-06-20: qty 2

## 2021-06-20 MED ORDER — DILTIAZEM HCL 25 MG/5ML IV SOLN
10.0000 mg | Freq: Once | INTRAVENOUS | Status: AC
  Administered 2021-06-20: 10 mg via INTRAVENOUS
  Filled 2021-06-20: qty 5

## 2021-06-20 NOTE — ED Triage Notes (Signed)
Pt to ED via EMS from home c/o palpitations tonight.  States was startled out of sleep by grandson and then felt heart racing, dizziness and some SOB.  States hx of afib, on eliquis.  States starting to feel a little better now.  Pt A&Ox4, chest rise even and unlabored, skin WNL and in NAD at this time.

## 2021-06-20 NOTE — ED Provider Notes (Addendum)
[facility] Emergency Department Provider Note  ____________________________________________   Event Date/Time   First MD Initiated Contact with Patient 06/20/21 0120     (approximate)  I have reviewed the triage vital signs and the nursing notes.   HISTORY  Chief Complaint Palpitations    HPI Earl Griffin is a 60 y.o. male history of atrial fibrillation on diltiazem and Eliquis, hypertension, hyperlipidemia, diabetes who presents to the emergency department with palpitations with chest tightness, shortness of breath and lightheadedness that started tonight around 9 PM.  States symptoms have now improved.  He thinks it was his A. fib.  He has cardiology follow-up.  No recent fevers, cough, vomiting, diarrhea.  No lower extremity swelling or pain.  No history of PE or DVT.  Reports compliance with his medications.  Last took diltiazem at 5 AM yesterday.  Is due for a dose at 5 AM today.        Past Medical History:  Diagnosis Date   A-fib [facility])    COVID-19 05/2019   Diagnosed Sept 2020, tested negative Dec 2020   Diabetes mellitus without complication (HCC)    GERD (gastroesophageal reflux disease)    Hyperlipidemia    Hypertension    Mild left ventricular hypertrophy    Unspecified atrial fibrillation [facility])     Patient Active Problem List   Diagnosis Date Noted   Acute respiratory failure with hypoxia (HCC) 06/06/2019   Chronic a-fib (HCC) 06/06/2019   Severe hypertension 06/06/2019   Type 2 diabetes mellitus with hyperlipidemia (HCC) 06/06/2019   Hyperglycemia 06/06/2019   AKI (acute kidney injury) (HCC) 06/06/2019   Hyponatremia 06/06/2019    Past Surgical History:  Procedure Laterality Date   NO PAST SURGERIES      Prior to Admission medications   Medication Sig Start Date End Date Taking? Authorizing Provider  apixaban (ELIQUIS) 5 MG TABS tablet Take 1 tablet (5 mg total) by mouth 2 (two) times daily. 12/02/15   Minna Antis,  MD  diltiazem (CARDIZEM) 120 MG tablet Take 120 mg by mouth daily.     [provider]  glimepiride (AMARYL) 4 MG tablet Take 1 tablet by mouth 2 (two) times daily. 01/18/18   [provider]  hydrochlorothiazide (HYDRODIURIL) 25 MG tablet Take 25 mg by mouth daily.  05/17/19   [provider]  lisinopril (ZESTRIL) 40 MG tablet Take 40 mg by mouth daily.  01/11/18   [provider]  metFORMIN (GLUCOPHAGE) 1000 MG tablet Take 1 tablet by mouth 2 (two) times daily. 12/02/17   [provider]    Allergies Patient has no known allergies.  Family History  Problem Relation Age of Onset   Breast cancer Mother    Hypertension Mother    Diabetes Mother    Heart attack Father    Hypertension Father    Diabetes Father     Social History Social History   Tobacco Use   Smoking status: Former    Types: Cigarettes    Quit date: 05/26/2008    Years since quitting: 13.0   Smokeless tobacco: Never  Vaping Use   Vaping Use: Never used  Substance Use Topics   Alcohol use: No   Drug use: Never    Review of Systems Constitutional: No fever. Eyes: No visual changes. ENT: No sore throat. Cardiovascular: + chest pain. Respiratory: + shortness of breath. Gastrointestinal: No nausea, vomiting, diarrhea. Genitourinary: Negative for dysuria. Musculoskeletal: Negative for back pain. Skin: Negative for rash. Neurological:  Negative for focal weakness or numbness.  ____________________________________________   PHYSICAL EXAM:  VITAL SIGNS: ED Triage Vitals  Enc Vitals Group     BP 06/20/21 0045 (!) 149/110     Pulse Rate 06/20/21 0045 (!) 114     Resp 06/20/21 0045 18     Temp 06/20/21 0045 98.2 F (36.8 C)     Temp Source 06/20/21 0045 Oral     SpO2 06/20/21 0045 94 %     Weight 06/20/21 0046 242 lb (109.8 kg)     Height 06/20/21 0046 6\' 3"  (1.905 m)     Head Circumference --      Peak Flow --      Pain Score 06/20/21 0045 0     Pain Loc --       Pain Edu? --      Excl. in GC? --    CONSTITUTIONAL: Alert and oriented and responds appropriately to questions. Well-appearing; well-nourished, extremely pleasant HEAD: Normocephalic EYES: Conjunctivae clear, pupils appear equal, EOM appear intact ENT: normal nose; moist mucous membranes NECK: Supple, normal ROM CARD: Irregularly irregular and minimally tachycardic; S1 and S2 appreciated; no murmurs, no clicks, no rubs, no gallops RESP: Normal chest excursion without splinting or tachypnea; breath sounds clear and equal bilaterally; no wheezes, no rhonchi, no rales, no hypoxia or respiratory distress, speaking full sentences ABD/GI: Normal bowel sounds; non-distended; soft, non-tender, no rebound, no guarding, no peritoneal signs, no hepatosplenomegaly BACK: The back appears normal EXT: Normal ROM in all joints; no deformity noted, no edema; no cyanosis, no calf tenderness or calf swelling SKIN: Normal color for age and race; warm; no rash on exposed skin NEURO: Moves all extremities equally PSYCH: The patient's mood and manner are appropriate.  ____________________________________________   LABS (all labs ordered are listed, but only abnormal results are displayed)  Labs Reviewed  BASIC METABOLIC PANEL - Abnormal; Notable for the following components:      Result Value   Glucose, Bld 382 (*)    Creatinine, Ser 1.34 (*)    All other components within normal limits  CBC  PROTIME-INR  T4, FREE  MAGNESIUM  TSH  TROPONIN I (HIGH SENSITIVITY)  TROPONIN I (HIGH SENSITIVITY)   ____________________________________________  EKG   Date: 06/20/2021 00:41   Rate: 124  Rhythm: Atrial fibrillation with RVR  QRS Axis: normal  Intervals: normal  ST/T Wave abnormalities: normal  Conduction Disutrbances: Left anterior fascicular block  Narrative Interpretation: A. fib with RVR, LAFB    ____________________________________________  RADIOLOGY I, Emmer Lillibridge, personally viewed  and evaluated these images (plain radiographs) as part of my medical decision making, as well as reviewing the written report by the radiologist.  ED MD interpretation: Chest x-ray clear.  Official radiology report(s): DG Chest 1 View  Result Date: 06/20/2021 CLINICAL DATA:  Cardiac palpitations EXAM: PORTABLE CHEST 1 VIEW COMPARISON:  05/23/2021 FINDINGS: Cardiac shadow is accentuated by the portable technique. Lungs are clear bilaterally. No bony abnormality is seen. IMPRESSION: No active disease. Electronically Signed   By: 07/23/2021 M.D.   On: 06/20/2021 01:26    ____________________________________________   PROCEDURES  Procedure(s) performed (including Critical Care):  Procedures   ____________________________________________   INITIAL IMPRESSION / ASSESSMENT AND PLAN / ED COURSE  As part of my medical decision making, I reviewed the following data within the electronic MEDICAL RECORD NUMBER Nursing notes reviewed and incorporated, Labs reviewed , EKG interpreted , Old EKG reviewed, Old chart reviewed, Radiograph reviewed , and Notes from  prior ED visits         Patient here with complaints of chest pain, shortness of breath, palpitations and dizziness that started tonight at 9 PM.  Was found to be in atrial fibrillation with a heart rate in the 120s.  Reports compliance with his medications.  Heart rate is now in the 90s but he is still hypertensive.  States his symptoms have resolved.  States he is feeling much better.  We will give some IV diltiazem here and his home oral medication.  We will check cardiac labs, electrolytes, thyroid function.  EKG shows no ischemia.  Chest x-ray clear.  Anticipate if work-up is unremarkable with 2 negative troponins and he continues to be rate controlled that he can be discharged home.  He is comfortable with this plan.  ED PROGRESS  Patient's labs reassuring.  Troponin x2 negative.  Normal electrolytes.  Normal thyroid function.  Normal  hemoglobin.  He is hyperglycemic which appears chronic for him but not in DKA.  His heart rate and blood pressure has improved.  His rate is now in the 70s but he is still in atrial fibrillation.  I have recommended close follow-up with his primary care doctor and cardiologist.  He is comfortable with this plan.  At this time, I do not feel there is any life-threatening condition present. I have reviewed, interpreted and discussed all results (EKG, imaging, lab, urine as appropriate) and exam findings with patient/family. I have reviewed nursing notes and appropriate previous records.  I feel the patient is safe to be discharged home without further emergent workup and can continue workup as an outpatient as needed. Discussed usual and customary return precautions. Patient/family verbalize understanding and are comfortable with this plan.  Outpatient follow-up has been provided as needed. All questions have been answered.  ____________________________________________   FINAL CLINICAL IMPRESSION(S) / ED DIAGNOSES  Final diagnoses:  Atrial fibrillation with rapid ventricular response (HCC)  Hypertension, unspecified type  Hyperglycemia     ED Discharge Orders     None       *Please note:  Earl Griffin was evaluated in Emergency Department on 06/20/2021 for the symptoms described in the history of present illness. He was evaluated in the context of the global COVID-19 pandemic, which necessitated consideration that the patient might be at risk for infection with the SARS-CoV-2 virus that causes COVID-19. Institutional protocols and algorithms that pertain to the evaluation of patients at risk for COVID-19 are in a state of rapid change based on information released by regulatory bodies including the CDC and federal and state organizations. These policies and algorithms were followed during the patient's care in the ED.  Some ED evaluations and interventions may be delayed as a result of limited  staffing during and the pandemic.*   Note:  This document was prepared using Dragon voice recognition software and may include unintentional dictation errors.    Marabella Popiel, Layla Maw, DO 06/20/21 0308    Annagrace Carr, Layla Maw, DO 06/20/21 4709

## 2021-06-20 NOTE — Discharge Instructions (Addendum)
Your labs today were reassuring.  I recommend close follow-up with your cardiologist to discuss potential medication changes.  Please continue your medications as prescribed at this time.  I also recommend close follow-up with your primary care doctor given your blood pressure and blood glucose today were elevated.

## 2021-06-20 NOTE — ED Triage Notes (Signed)
FIRST NURSE NOTE:  Pt arrived via [facility] from home with reports of palpitations that started 2 hours ago while laying on the couch.  EKG-AFIB RVR on cardizem   Seen 2 weeks ago for the same.    C/o dizziness with standing  Cbg 362 20g L FA 300cc NS given by EMS  P-113  145/93 97%RA

## 2021-08-03 ENCOUNTER — Emergency Department
Admission: EM | Admit: 2021-08-03 | Discharge: 2021-08-03 | Disposition: A | Discharge status: Home / Self Care | Payer: BC Managed Care – PPO | Attending: Student in an Organized Health Care Education/Training Program | Admitting: Student in an Organized Health Care Education/Training Program

## 2021-08-03 ENCOUNTER — Other Ambulatory Visit: Payer: Self-pay

## 2021-08-03 DIAGNOSIS — R519 Headache, unspecified: Secondary | ICD-10-CM | POA: Diagnosis not present

## 2021-08-03 DIAGNOSIS — Z5321 Procedure and treatment not carried out due to patient leaving prior to being seen by health care provider: Secondary | ICD-10-CM | POA: Insufficient documentation

## 2021-08-03 DIAGNOSIS — R42 Dizziness and giddiness: Secondary | ICD-10-CM | POA: Insufficient documentation

## 2021-08-03 LAB — CBC
HCT: 43.7 % (ref 39.0–52.0)
Hemoglobin: 14.6 g/dL (ref 13.0–17.0)
MCH: 28.5 pg (ref 26.0–34.0)
MCHC: 33.4 g/dL (ref 30.0–36.0)
MCV: 85.4 fL (ref 80.0–100.0)
Platelets: 230 10*3/uL (ref 150–400)
RBC: 5.12 MIL/uL (ref 4.22–5.81)
RDW: 11.9 % (ref 11.5–15.5)
WBC: 4.5 10*3/uL (ref 4.0–10.5)
nRBC: 0 % (ref 0.0–0.2)

## 2021-08-03 LAB — BASIC METABOLIC PANEL
Anion gap: 8 (ref 5–15)
BUN: 14 mg/dL (ref 6–20)
CO2: 26 mmol/L (ref 22–32)
Calcium: 9.6 mg/dL (ref 8.9–10.3)
Chloride: 105 mmol/L (ref 98–111)
Creatinine, Ser: 1.17 mg/dL (ref 0.61–1.24)
GFR, Estimated: >60 mL/min (ref >60)
Glucose, Bld: 102 mg/dL — ABNORMAL HIGH (ref 70–99)
Potassium: 3.4 mmol/L — ABNORMAL LOW (ref 3.5–5.1)
Sodium: 139 mmol/L (ref 135–145)

## 2021-08-03 NOTE — ED Triage Notes (Signed)
Pt reports had a HA last pm and this am the HA was mostly gone but has some pressure and some dizziness as well.

## 2021-08-03 NOTE — ED Triage Notes (Signed)
Pt in via EMS from home with c/o head pressure. Pt BP was 210/110, HR 77, 97%RA, FSBS 108. Pt reports had a HA last pm, felt better but today he woke up with a HA still and has been somewhat dizzy today.

## 2021-08-11 ENCOUNTER — Emergency Department
Admission: EM | Admit: 2021-08-11 | Discharge: 2021-08-11 | Disposition: A | Discharge status: Home / Self Care | Payer: BC Managed Care – PPO | Attending: Emergency Medicine | Admitting: Emergency Medicine

## 2021-08-11 ENCOUNTER — Other Ambulatory Visit: Payer: Self-pay

## 2021-08-11 ENCOUNTER — Emergency Department: Payer: BC Managed Care – PPO

## 2021-08-11 DIAGNOSIS — Z7984 Long term (current) use of oral hypoglycemic drugs: Secondary | ICD-10-CM | POA: Insufficient documentation

## 2021-08-11 DIAGNOSIS — I10 Essential (primary) hypertension: Secondary | ICD-10-CM | POA: Diagnosis not present

## 2021-08-11 DIAGNOSIS — I160 Hypertensive urgency: Secondary | ICD-10-CM | POA: Insufficient documentation

## 2021-08-11 DIAGNOSIS — E119 Type 2 diabetes mellitus without complications: Secondary | ICD-10-CM | POA: Insufficient documentation

## 2021-08-11 DIAGNOSIS — Z79899 Other long term (current) drug therapy: Secondary | ICD-10-CM | POA: Diagnosis not present

## 2021-08-11 DIAGNOSIS — Z7901 Long term (current) use of anticoagulants: Secondary | ICD-10-CM | POA: Insufficient documentation

## 2021-08-11 DIAGNOSIS — I4891 Unspecified atrial fibrillation: Secondary | ICD-10-CM | POA: Insufficient documentation

## 2021-08-11 DIAGNOSIS — R42 Dizziness and giddiness: Secondary | ICD-10-CM | POA: Diagnosis present

## 2021-08-11 DIAGNOSIS — Z87891 Personal history of nicotine dependence: Secondary | ICD-10-CM | POA: Diagnosis not present

## 2021-08-11 DIAGNOSIS — Z8616 Personal history of COVID-19: Secondary | ICD-10-CM | POA: Insufficient documentation

## 2021-08-11 LAB — COMPREHENSIVE METABOLIC PANEL
ALT: 23 U/L (ref 0–44)
AST: 18 U/L (ref 15–41)
Albumin: 4.2 g/dL (ref 3.5–5.0)
Alkaline Phosphatase: 84 U/L (ref 38–126)
Anion gap: 6 (ref 5–15)
BUN: 18 mg/dL (ref 6–20)
CO2: 25 mmol/L (ref 22–32)
Calcium: 9.3 mg/dL (ref 8.9–10.3)
Chloride: 105 mmol/L (ref 98–111)
Creatinine, Ser: 0.96 mg/dL (ref 0.61–1.24)
GFR, Estimated: >60 mL/min (ref >60)
Glucose, Bld: 135 mg/dL — ABNORMAL HIGH (ref 70–99)
Potassium: 3.9 mmol/L (ref 3.5–5.1)
Sodium: 136 mmol/L (ref 135–145)
Total Bilirubin: 0.6 mg/dL (ref 0.3–1.2)
Total Protein: 7.4 g/dL (ref 6.5–8.1)

## 2021-08-11 LAB — CBC
HCT: 45.5 % (ref 39.0–52.0)
Hemoglobin: 15.4 g/dL (ref 13.0–17.0)
MCH: 28.8 pg (ref 26.0–34.0)
MCHC: 33.8 g/dL (ref 30.0–36.0)
MCV: 85 fL (ref 80.0–100.0)
Platelets: 209 10*3/uL (ref 150–400)
RBC: 5.35 MIL/uL (ref 4.22–5.81)
RDW: 11.9 % (ref 11.5–15.5)
WBC: 5.2 10*3/uL (ref 4.0–10.5)
nRBC: 0 % (ref 0.0–0.2)

## 2021-08-11 LAB — DIFFERENTIAL
Abs Immature Granulocytes: 0.01 10*3/uL (ref 0.00–0.07)
Basophils Absolute: 0 10*3/uL (ref 0.0–0.1)
Basophils Relative: 1 %
Eosinophils Absolute: 0.1 10*3/uL (ref 0.0–0.5)
Eosinophils Relative: 2 %
Immature Granulocytes: 0 %
Lymphocytes Relative: 29 %
Lymphs Abs: 1.5 10*3/uL (ref 0.7–4.0)
Monocytes Absolute: 0.4 10*3/uL (ref 0.1–1.0)
Monocytes Relative: 8 %
Neutro Abs: 3.1 10*3/uL (ref 1.7–7.7)
Neutrophils Relative %: 60 %

## 2021-08-11 LAB — PROTIME-INR
INR: 1.2 (ref 0.8–1.2)
Prothrombin Time: 15.1 seconds (ref 11.4–15.2)

## 2021-08-11 LAB — APTT: aPTT: 29 seconds (ref 24–36)

## 2021-08-11 MED ORDER — SODIUM CHLORIDE 0.9% FLUSH
3.0000 mL | Freq: Once | INTRAVENOUS | Status: DC
Start: 2021-08-11 — End: 2021-08-11

## 2021-08-11 MED ORDER — SPIRONOLACTONE 25 MG PO TABS: 25.0000 mg | ORAL_TABLET | Freq: Every day | ORAL | 6 refills | Status: AC

## 2021-08-11 NOTE — ED Provider Notes (Signed)
[facility] Emergency Department Provider Note  ____________________________________________  Time seen: Approximately 4:52 PM  I have reviewed the triage vital signs and the nursing notes.   HISTORY  Chief Complaint Hypertension    HPI Newton Frutiger is a 60 y.o. male who presents emergency department with elevated blood pressure readings.  Patient states that he has a history of having a difficult time controlling his blood pressure.  He does have some anxiety as well as whitecoat syndrome and states that his pressures typically run okay at home, are slightly elevated in the office.  Patient has been on multiple different antihypertensive medicines and is currently on Cardizem for A. fib and Benicar for his hypertension.  Patient has not tolerated HCTZ well in the past.  He had been changed from lisinopril to Benicar after this medicine did not seem to be effective in controlling his hypertension.  Patient typically has lower end of normal resting heart rate and has not been on beta-blockers in the past.  Patient states that when he took his blood pressure he felt like his anxiety was increasing, he became dizzy, had some tingling in his left arm.  Patient called his primary care provider and was referred to the emergency department.  Majority of symptoms have improved at this time though he still concerned about his blood pressure.  Appears that patient has had difficulty controlling his blood pressure in regards to his medical record.  Apparently his primary care was concerned about his blood pressures at his last visit which was 2 weeks ago.  If pressures remained somewhat elevated they were considering adding another medicine, spironolactone.       Past Medical History:  Diagnosis Date   A-fib [facility])    COVID-19 05/2019   Diagnosed Sept 2020, tested negative Dec 2020   Diabetes mellitus without complication (HCC)    GERD (gastroesophageal reflux disease)     Hyperlipidemia    Hypertension    Mild left ventricular hypertrophy    Unspecified atrial fibrillation [facility])     Patient Active Problem List   Diagnosis Date Noted   Acute respiratory failure with hypoxia (HCC) 06/06/2019   Chronic a-fib (HCC) 06/06/2019   Severe hypertension 06/06/2019   Type 2 diabetes mellitus with hyperlipidemia (HCC) 06/06/2019   Hyperglycemia 06/06/2019   AKI (acute kidney injury) (HCC) 06/06/2019   Hyponatremia 06/06/2019    Past Surgical History:  Procedure Laterality Date   NO PAST SURGERIES      Prior to Admission medications   Medication Sig Start Date End Date Taking? Authorizing Provider  spironolactone (ALDACTONE) 25 MG tablet Take 1 tablet (25 mg total) by mouth daily. 08/11/21 08/11/22 Yes Mariadelosang Wynns, Delorise Royals, PA-C  apixaban (ELIQUIS) 5 MG TABS tablet Take 1 tablet (5 mg total) by mouth 2 (two) times daily. 12/02/15   Minna Antis, MD  diltiazem (CARDIZEM) 120 MG tablet Take 120 mg by mouth daily.     [provider]  glimepiride (AMARYL) 4 MG tablet Take 1 tablet by mouth 2 (two) times daily. 01/18/18   [provider]  hydrochlorothiazide (HYDRODIURIL) 25 MG tablet Take 25 mg by mouth daily.  05/17/19   [provider]  lisinopril (ZESTRIL) 40 MG tablet Take 40 mg by mouth daily.  01/11/18   [provider]  metFORMIN (GLUCOPHAGE) 1000 MG tablet Take 1 tablet by mouth 2 (two) times daily. 12/02/17   [provider]    Allergies Patient has no known allergies.  Family History  Problem Relation Age of Onset   Breast cancer Mother    Hypertension Mother    Diabetes Mother    Heart attack Father    Hypertension Father    Diabetes Father     Social History Social History   Tobacco Use   Smoking status: Former    Types: Cigarettes    Quit date: 05/26/2008    Years since quitting: 13.2   Smokeless tobacco: Never  Vaping Use   Vaping Use: Never used  Substance Use Topics   Alcohol use: No    Drug use: Never     Review of Systems  Constitutional: No fever/chills Eyes: No visual changes. No discharge ENT: No upper respiratory complaints. Cardiovascular: no chest pain. Respiratory: no cough. No SOB. Gastrointestinal: No abdominal pain.  No nausea, no vomiting.  No diarrhea.  No constipation. Musculoskeletal: Negative for musculoskeletal pain. Skin: Negative for rash, abrasions, lacerations, ecchymosis. Neurological: Negative for headaches, focal weakness or numbness.  10 System ROS otherwise negative.  ____________________________________________   PHYSICAL EXAM:  VITAL SIGNS: ED Triage Vitals  Enc Vitals Group     BP 08/11/21 1502 (!) 155/101     Pulse Rate 08/11/21 1502 61     Resp 08/11/21 1502 18     Temp 08/11/21 1502 (!) 97.4 F (36.3 C)     Temp Source 08/11/21 1502 Oral     SpO2 08/11/21 1502 98 %     Weight 08/11/21 1500 245 lb (111.1 kg)     Height 08/11/21 1500 6\' 3"  (1.905 m)     Head Circumference --      Peak Flow --      Pain Score 08/11/21 1500 0     Pain Loc --      Pain Edu? --      Excl. in GC? --      Constitutional: Alert and oriented. Well appearing and in no acute distress. Eyes: Conjunctivae are normal. PERRL. EOMI. Head: Atraumatic. ENT:      Ears:       Nose: No congestion/rhinnorhea.      Mouth/Throat: Mucous membranes are moist.  Neck: No stridor.    Cardiovascular: Normal rate, regular rhythm. Normal S1 and S2.  Good peripheral circulation. Respiratory: Normal respiratory effort without tachypnea or retractions. Lungs CTAB. Good air entry to the bases with no decreased or absent breath sounds. Musculoskeletal: Full range of motion to all extremities. No gross deformities appreciated. Neurologic:  Normal speech and language. No gross focal neurologic deficits are appreciated.  Cranial nerves II through XII grossly intact Skin:  Skin is warm, dry and intact. No rash noted. Psychiatric: Mood and affect are normal. Speech  and behavior are normal. Patient exhibits appropriate insight and judgement.   ____________________________________________   LABS (all labs ordered are listed, but only abnormal results are displayed)  Labs Reviewed  COMPREHENSIVE METABOLIC PANEL - Abnormal; Notable for the following components:      Result Value   Glucose, Bld 135 (*)    All other components within normal limits  PROTIME-INR  APTT  CBC  DIFFERENTIAL  CBG MONITORING, ED  I-STAT CREATININE, ED   ____________________________________________  EKG   ____________________________________________  RADIOLOGY I personally viewed and evaluated these images as part of my medical decision making, as well as reviewing the written report by the radiologist.  ED Provider Interpretation: No acute findings on CT head  CT HEAD WO CONTRAST  Result Date: 08/11/2021 CLINICAL DATA:  Dizziness, nonspecific. Additional history provided:  Hypertension, dizziness. Left-sided numbness and tingling this morning. EXAM: CT HEAD WITHOUT CONTRAST TECHNIQUE: Contiguous axial images were obtained from the base of the skull through the vertex without intravenous contrast. COMPARISON:  Head CT 11/23/2020. brain MRI 10/20/2019. FINDINGS: Brain: Cerebral volume is normal. Mild-to-moderate patchy and ill-defined hypoattenuation within the cerebral white matter, nonspecific but compatible with chronic small vessel ischemic disease. Redemonstrated partially empty sella turcica, a nonspecific finding. There is no acute intracranial hemorrhage. No demarcated cortical infarct. No extra-axial fluid collection. No evidence of an intracranial mass. No midline shift. Vascular: No hyperdense vessel.  Atherosclerotic calcifications. Skull: Normal. Negative for fracture or focal lesion. Sinuses/Orbits: Visualized orbits show no acute finding. Mild mucosal thickening within the bilateral ethmoid sinuses. Small mucous retention cysts within the bilateral sphenoid  sinuses. IMPRESSION: No evidence of acute intracranial abnormality. Redemonstrated mild-to-moderate chronic small vessel ischemic changes within the cerebral white matter. Mild paranasal sinus disease at the imaged levels, as described. Electronically Signed   By: Jackey Loge D.O.   On: 08/11/2021 15:37    ____________________________________________    PROCEDURES  Procedure(s) performed:    Procedures    Medications  sodium chloride flush (NS) 0.9 % injection 3 mL (has no administration in time range)     ____________________________________________   INITIAL IMPRESSION / ASSESSMENT AND PLAN / ED COURSE  Pertinent labs & imaging results that were available during my care of the patient were reviewed by me and considered in my medical decision making (see chart for details).  Review of the [place] CSRS was performed in accordance of the [facility] prior to dispensing any controlled drugs.           Patient's diagnosis is consistent with hypertension.  Patient presented to the emergency department with high blood pressure.  Patient states that he has had issues controlling his blood pressure in the past, had systolic pressures over 200 and diastolics over 120 earlier today.  Patient arrived with a pressure of 155/100.  Symptoms have largely resolved at this time.  It appears that he has had difficulties controlling his blood pressure and at his last primary care visit 2 weeks ago they were considering adding another medicine at that time.  Appears that primary care would like to add spironolactone.  This time I will add this medication for the patient.  I discussed concerning signs and symptoms to return to the ED for.  I encouraged the patient need to randomly check his pressures roughly 5-10 times a day, keep a journal to see where his average pressure is truly residing.  Patient is agreeable with this plan.  Follow-up with primary care.. Patient is given ED precautions to return to the ED  for any worsening or new symptoms.     ____________________________________________  FINAL CLINICAL IMPRESSION(S) / ED DIAGNOSES  Final diagnoses:  Hypertensive urgency      NEW MEDICATIONS STARTED DURING THIS VISIT:  ED Discharge Orders          Ordered    spironolactone (ALDACTONE) 25 MG tablet  Daily        08/11/21 1743                This chart was dictated using voice recognition software/Dragon. Despite best efforts to proofread, errors can occur which can change the meaning. Any change was purely unintentional.    Racheal Patches, PA-C 08/11/21 1744    Merwyn Katos, MD 08/12/21 [phone]

## 2021-08-11 NOTE — ED Triage Notes (Addendum)
Pt here with hypertension, 170/120 at home. Pt takes bp meds and took it this morning. Pt states he had some left side numbness and tingling this morning as well. Pt also states small headache. Pt denies anything new. Pt in NAD in triage.

## 2022-04-22 ENCOUNTER — Other Ambulatory Visit: Payer: Self-pay | Admitting: Student

## 2022-04-22 DIAGNOSIS — R931 Abnormal findings on diagnostic imaging of heart and coronary circulation: Secondary | ICD-10-CM

## 2022-04-22 DIAGNOSIS — R42 Dizziness and giddiness: Secondary | ICD-10-CM

## 2022-05-05 ENCOUNTER — Telehealth (HOSPITAL_COMMUNITY): Payer: Self-pay | Admitting: *Deleted

## 2022-05-05 NOTE — Telephone Encounter (Signed)
Reaching out to patient to offer assistance regarding upcoming cardiac imaging study; pt verbalizes understanding of appt date/time, parking situation and where to check in, and verified current allergies; name and call back number provided for further questions should they arise  Elishua Radford RN Navigator Cardiac Imaging [facility] [phone] office [phone] cell  Patient reports some claustrophobia but denies metal. 

## 2022-05-06 ENCOUNTER — Other Ambulatory Visit: Payer: Self-pay | Admitting: Student

## 2022-05-06 ENCOUNTER — Ambulatory Visit
Admission: RE | Admit: 2022-05-06 | Discharge: 2022-05-06 | Disposition: A | Discharge status: Home / Self Care | Payer: BC Managed Care – PPO | Source: Ambulatory Visit | Attending: Student | Admitting: Student

## 2022-05-06 DIAGNOSIS — R42 Dizziness and giddiness: Secondary | ICD-10-CM | POA: Diagnosis present

## 2022-05-06 DIAGNOSIS — R931 Abnormal findings on diagnostic imaging of heart and coronary circulation: Secondary | ICD-10-CM

## 2022-05-06 MED ORDER — GADOBUTROL 1 MMOL/ML IV SOLN
14.0000 mL | Freq: Once | INTRAVENOUS | Status: AC | PRN
  Administered 2022-05-06: 14 mL via INTRAVENOUS

## 2023-03-16 IMAGING — CR DG CHEST 2V
1 series · 2 of 2 positions shown · non-contrast
Comparison: Chest radiographs 04/10/2021 and earlier.

CLINICAL DATA: 60-year-old male with palpitations and chest
tightness since yesterday. History of atrial fibrillation

EXAM:
CHEST - 2 VIEW

[Series 1: dg chest 2 view · 0.14mm/px · 2 of 2 slices shown]
[im 1/2]
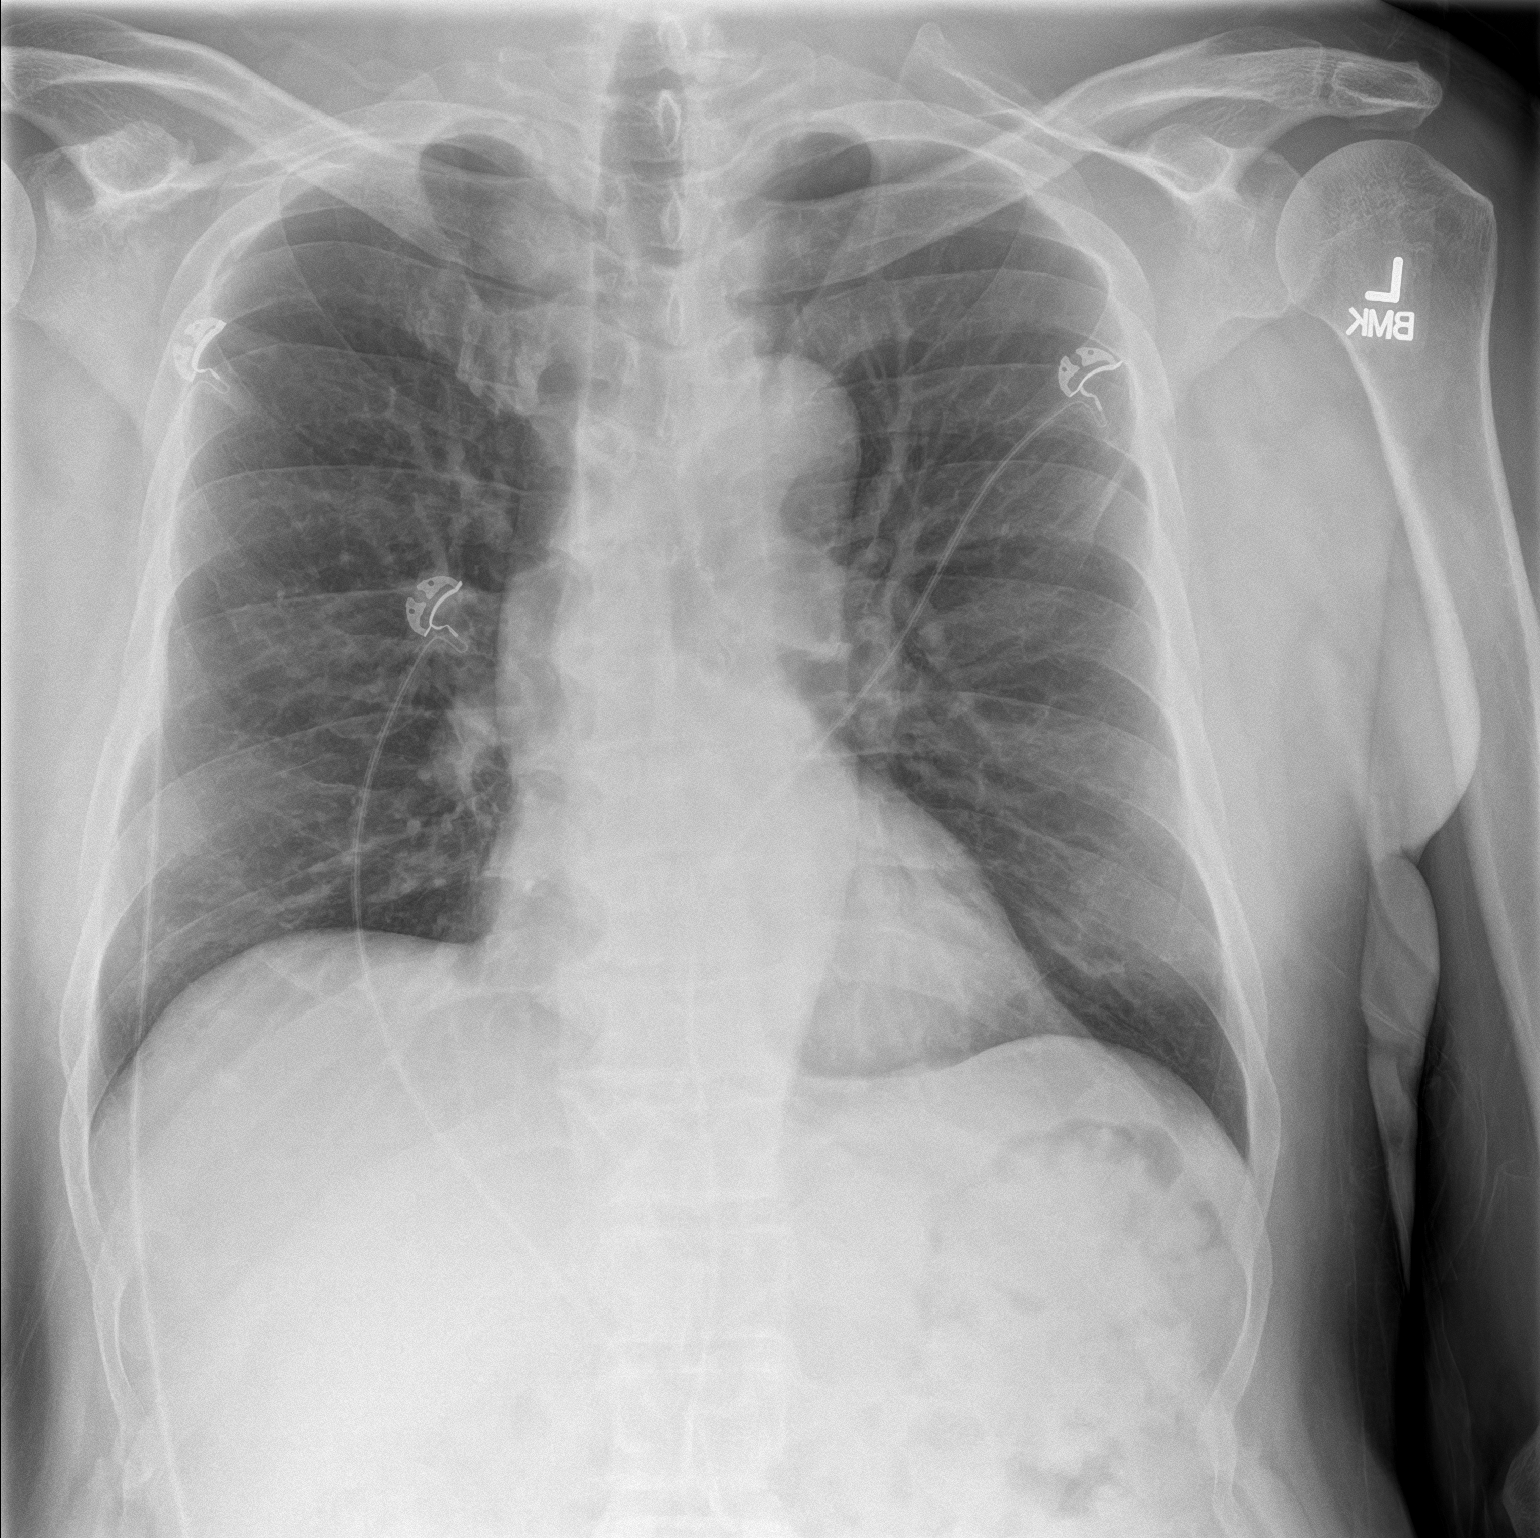
[im 2/2]
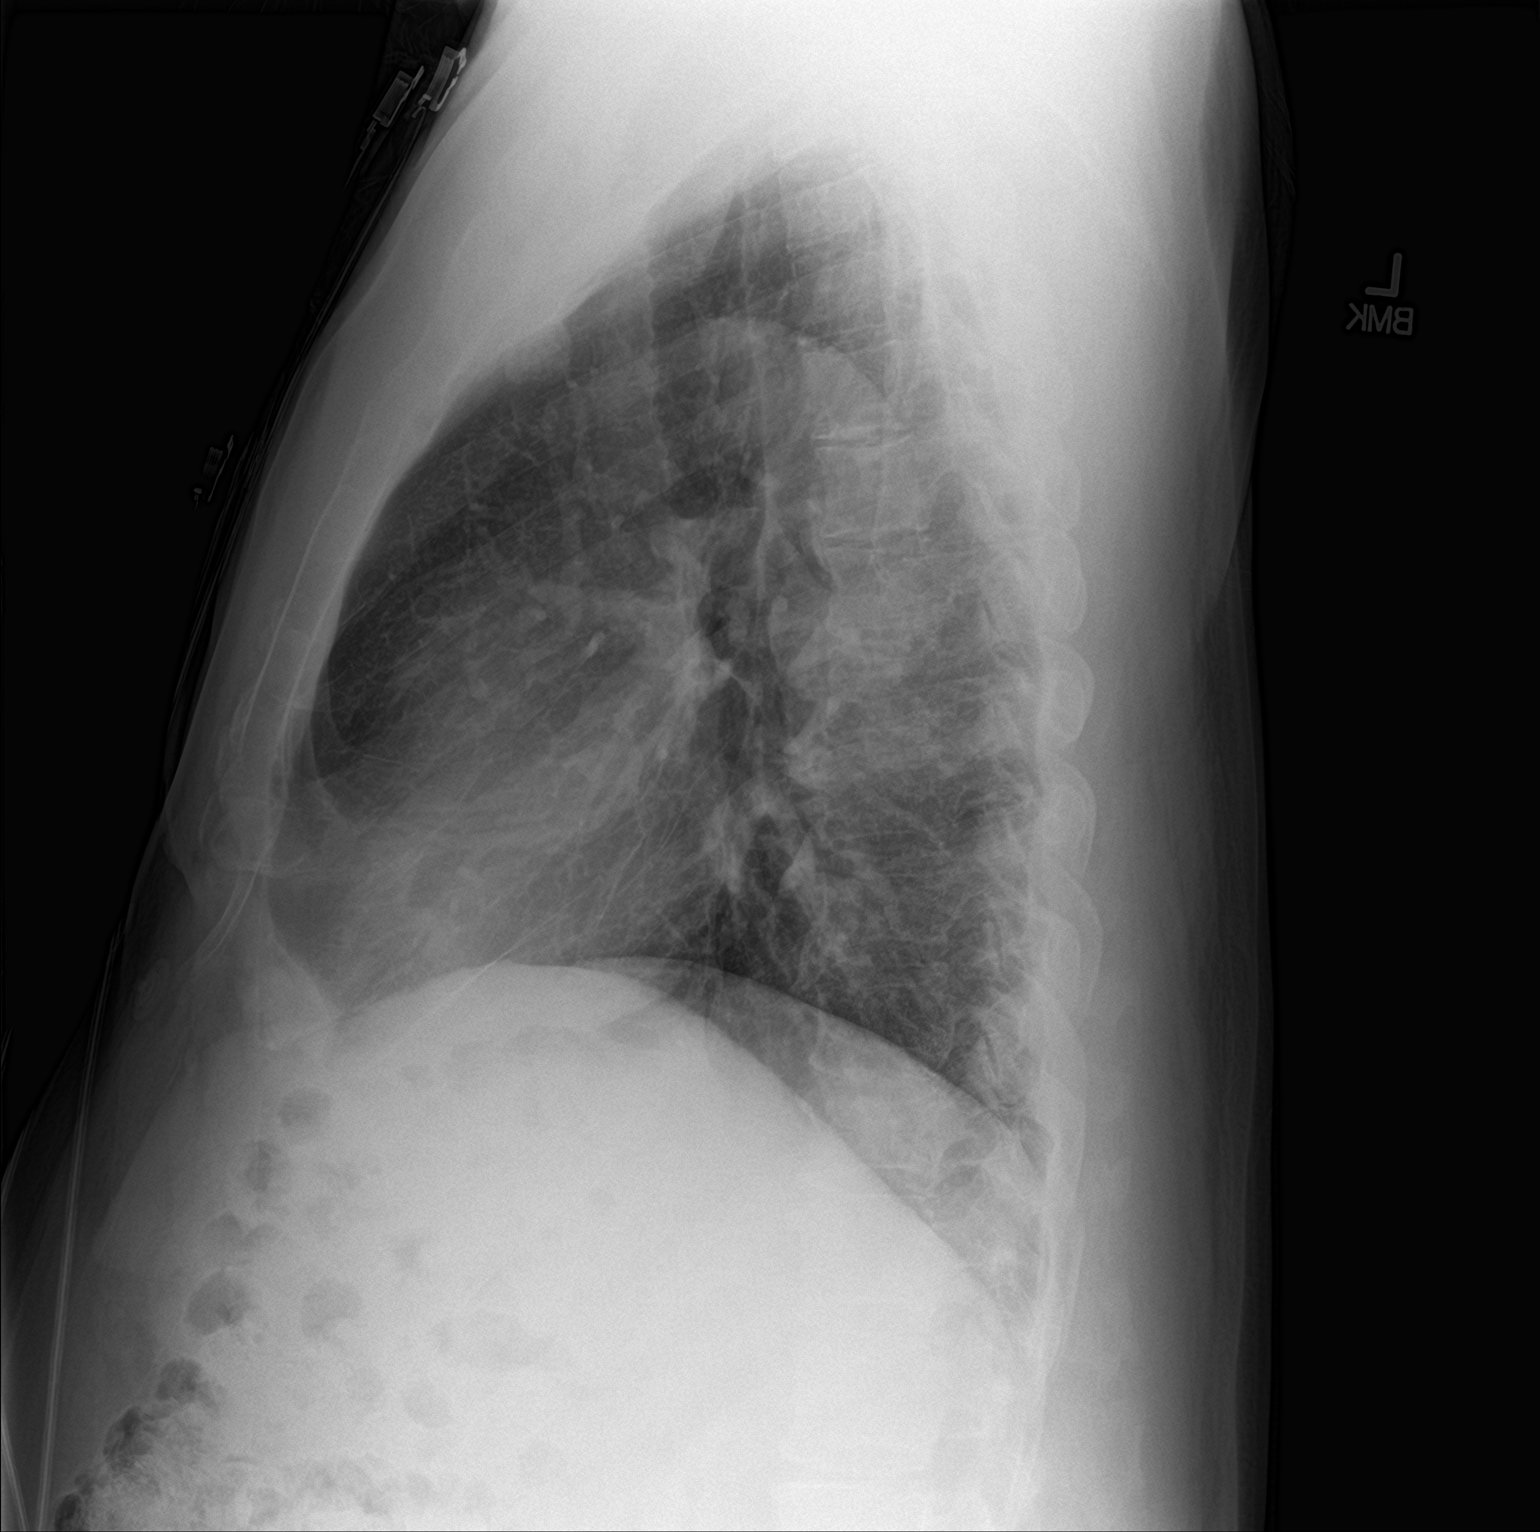

[2 of 2 positions shown; findings below may reference images not displayed]

FINDINGS: Stable lung volumes and mediastinal contours, heart size remains
within normal limits. Visualized tracheal air column is within
normal limits. Lung volumes are stable and within normal limits. No
pneumothorax, pulmonary edema, pleural effusion or confluent
pulmonary opacity.

Negative visible bowel gas pattern. No acute osseous abnormality
identified.
IMPRESSION: No acute cardiopulmonary abnormality.

## 2023-06-04 IMAGING — CT CT HEAD W/O CM
4 series · 16 of 47 positions shown, 18 images · non-contrast
Comparison: Head CT 11/23/2020. brain MRI 10/20/2019.

CLINICAL DATA: Dizziness, nonspecific. Additional history provided:
Hypertension, dizziness. Left-sided numbness and tingling this
morning.

EXAM:
CT HEAD WITHOUT CONTRAST
TECHNIQUE: Contiguous axial images were obtained from the base of the skull
through the vertex without intravenous contrast.

[Series 2: head wo · axial · 0.46mm/px · z∈[-109,+1]mm · 7 of 30 slices shown, 9 images]
[im 4/30  brain]
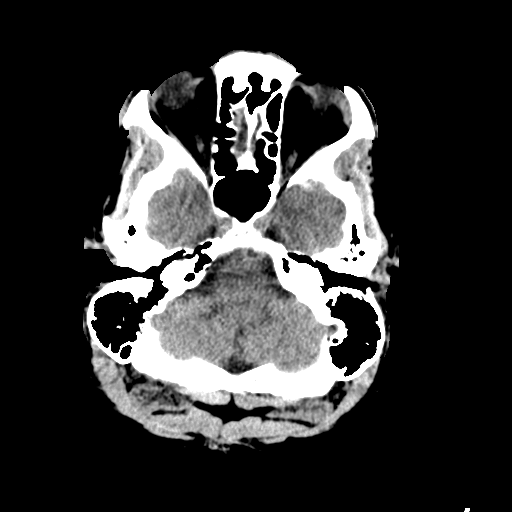
[im 4/30  bone]
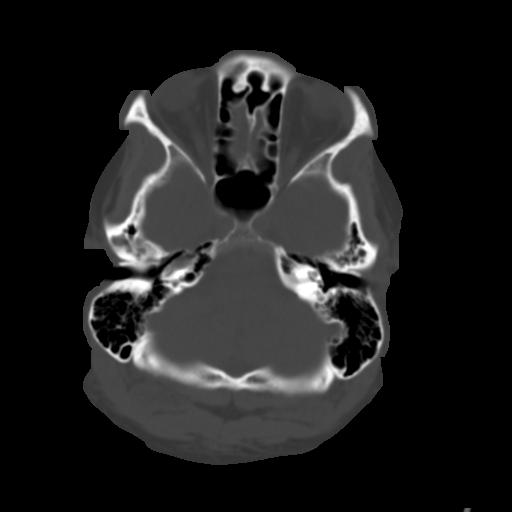
[im 8/30  brain]
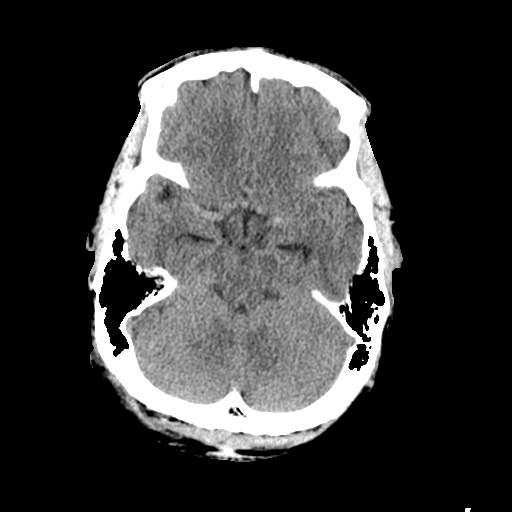
[im 11/30  brain]
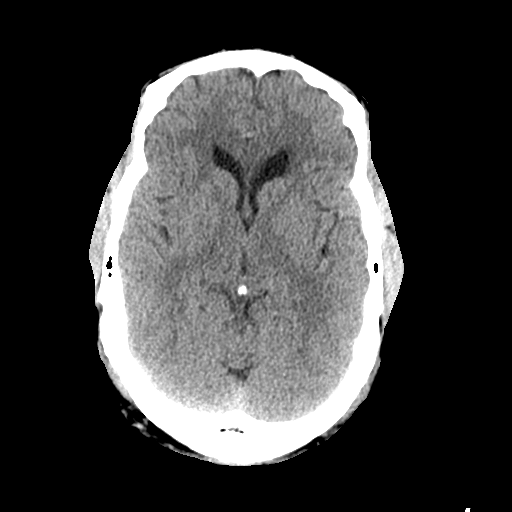
[im 15/30  brain]
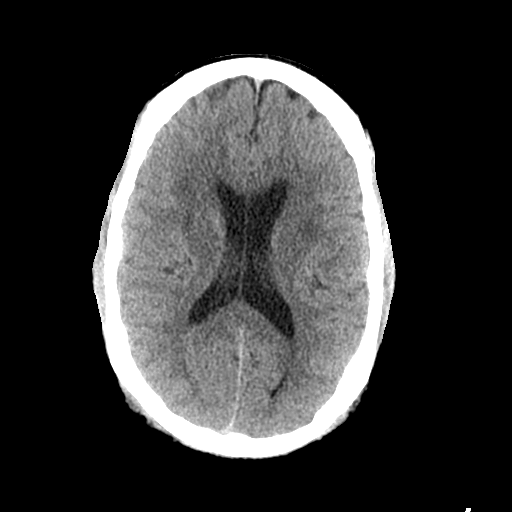
[im 19/30  brain]
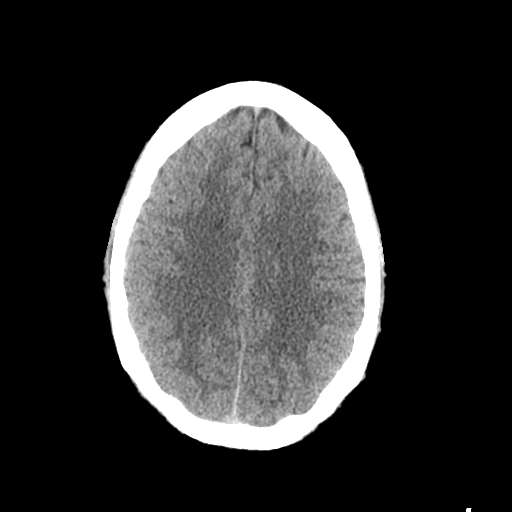
[im 19/30  bone]
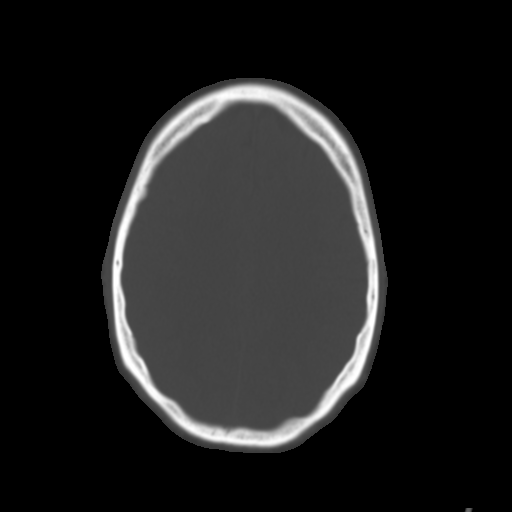
[im 22/30  brain]
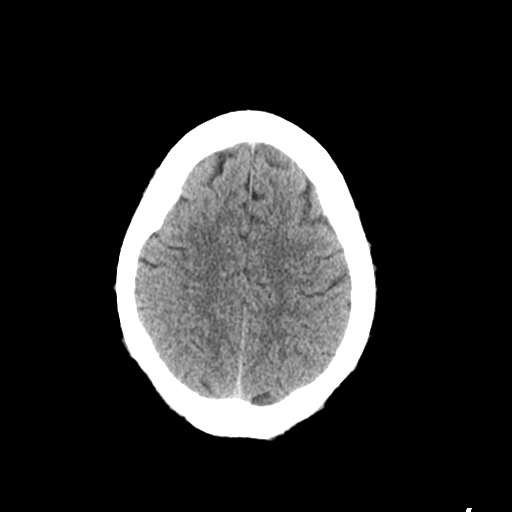
[im 26/30  brain]
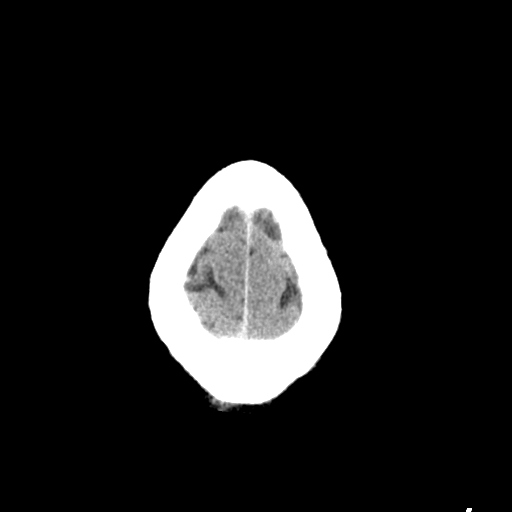

[Series 3: head bone · axial · 0.46mm/px · z∈[-110,-80]mm · 3 of 75 slices shown]
[im 8/75  bone]
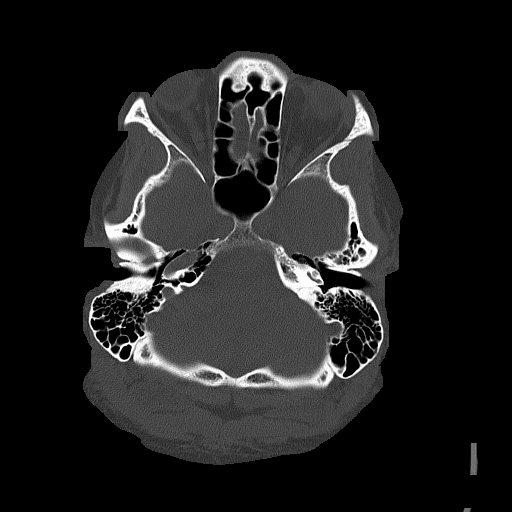
[im 15/75  bone]
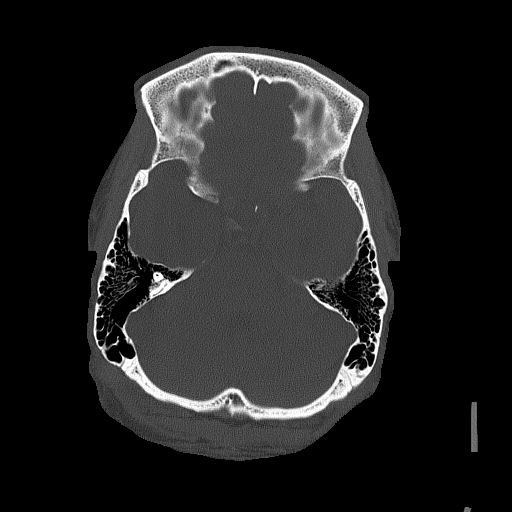
[im 23/75  bone]
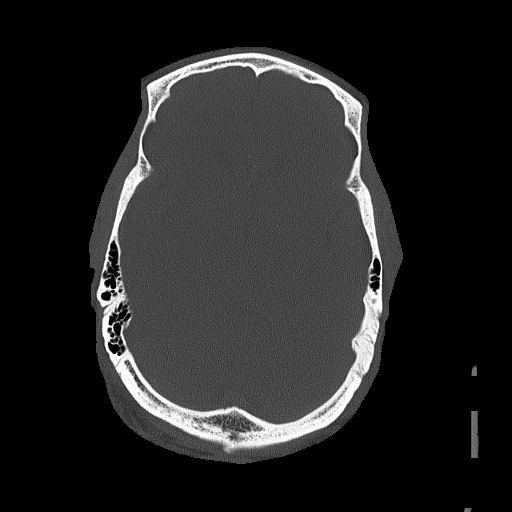

[Series 4: coronal soft tissue · coronal · 0.32mm/px · 3 of 67 slices shown]
[im 23/67  brain]
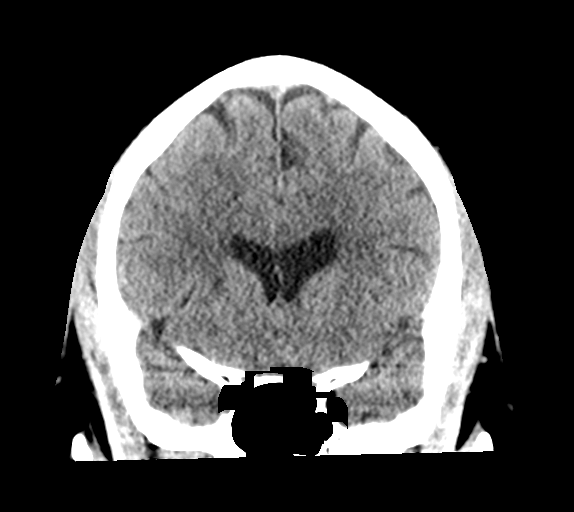
[im 30/67  brain]
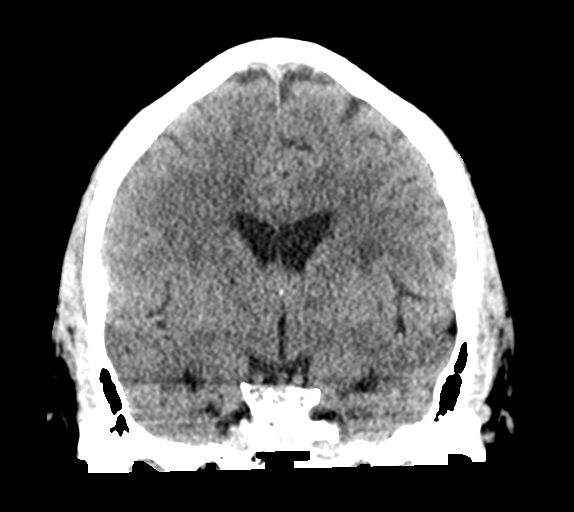
[im 37/67  brain]
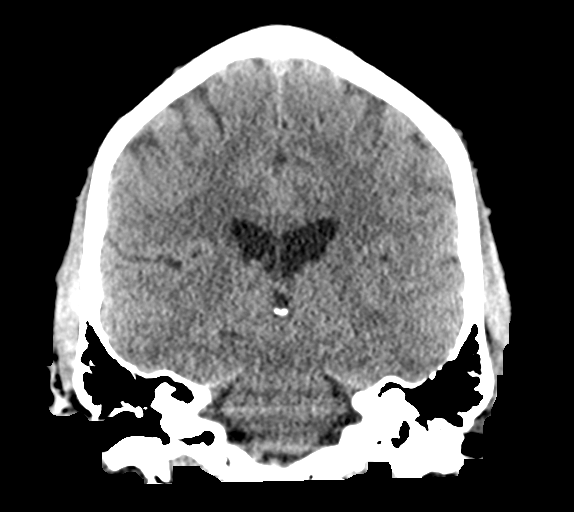

[Series 5: sagittal soft tissue · sagittal · 0.33mm/px · 3 of 54 slices shown]
[im 18/54  brain]
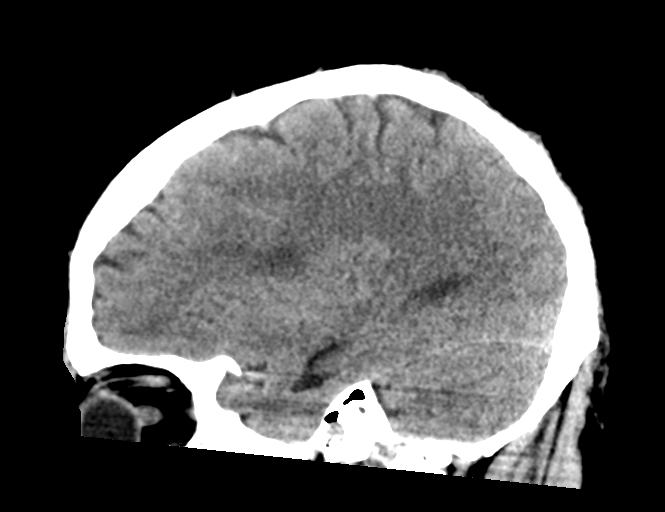
[im 27/54  brain]
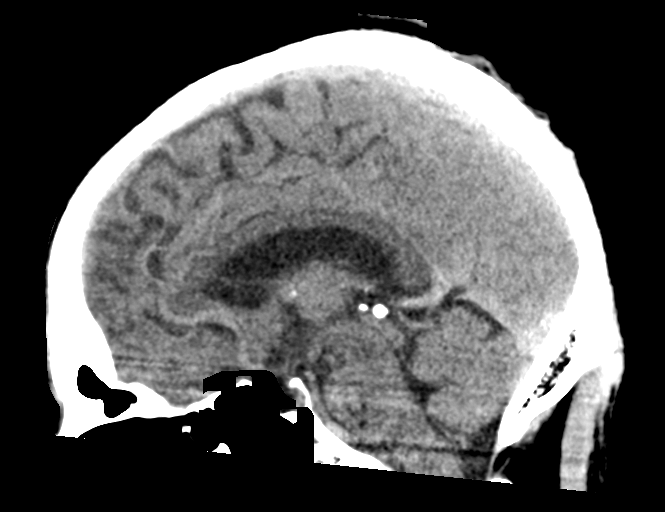
[im 36/54  brain]
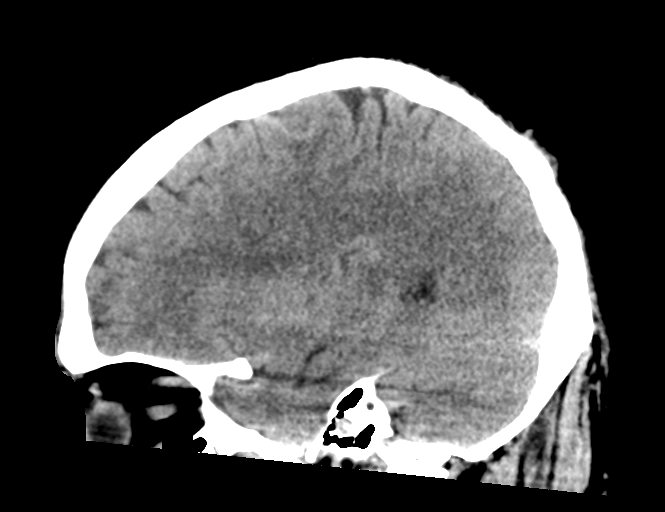

[16 of 47 positions shown; findings below may reference images not displayed]

FINDINGS: Brain:

Cerebral volume is normal.

Mild-to-moderate patchy and ill-defined hypoattenuation within the
cerebral white matter, nonspecific but compatible with chronic small
vessel ischemic disease.

Redemonstrated partially empty sella turcica, a nonspecific finding.

There is no acute intracranial hemorrhage.

No demarcated cortical infarct.

No extra-axial fluid collection.

No evidence of an intracranial mass.

No midline shift.

Vascular: No hyperdense vessel.  Atherosclerotic calcifications.

Skull: Normal. Negative for fracture or focal lesion.

Sinuses/Orbits: Visualized orbits show no acute finding. Mild
mucosal thickening within the bilateral ethmoid sinuses. Small
mucous retention cysts within the bilateral sphenoid sinuses.
IMPRESSION: No evidence of acute intracranial abnormality.

Redemonstrated mild-to-moderate chronic small vessel ischemic
changes within the cerebral white matter.

Mild paranasal sinus disease at the imaged levels, as described.

## 2024-02-11 ENCOUNTER — Other Ambulatory Visit: Payer: Self-pay

## 2024-02-11 ENCOUNTER — Emergency Department

## 2024-02-11 ENCOUNTER — Emergency Department: Admission: EM | Admit: 2024-02-11 | Discharge: 2024-02-11 | Disposition: A | Discharge status: Home / Self Care

## 2024-02-11 DIAGNOSIS — R0789 Other chest pain: Secondary | ICD-10-CM | POA: Diagnosis not present

## 2024-02-11 DIAGNOSIS — N179 Acute kidney failure, unspecified: Secondary | ICD-10-CM | POA: Insufficient documentation

## 2024-02-11 DIAGNOSIS — R42 Dizziness and giddiness: Secondary | ICD-10-CM | POA: Insufficient documentation

## 2024-02-11 DIAGNOSIS — E1165 Type 2 diabetes mellitus with hyperglycemia: Secondary | ICD-10-CM | POA: Diagnosis not present

## 2024-02-11 DIAGNOSIS — I10 Essential (primary) hypertension: Secondary | ICD-10-CM | POA: Diagnosis not present

## 2024-02-11 DIAGNOSIS — Z7901 Long term (current) use of anticoagulants: Secondary | ICD-10-CM | POA: Insufficient documentation

## 2024-02-11 LAB — BASIC METABOLIC PANEL WITH GFR
Anion gap: 10 (ref 5–15)
BUN: 21 mg/dL (ref 8–23)
CO2: 21 mmol/L — ABNORMAL LOW (ref 22–32)
Calcium: 9.3 mg/dL (ref 8.9–10.3)
Chloride: 112 mmol/L — ABNORMAL HIGH (ref 98–111)
Creatinine, Ser: 1.41 mg/dL — ABNORMAL HIGH (ref 0.61–1.24)
GFR, Estimated: 56 mL/min — ABNORMAL LOW (ref >60)
Glucose, Bld: 217 mg/dL — ABNORMAL HIGH (ref 70–99)
Potassium: 4.5 mmol/L (ref 3.5–5.1)
Sodium: 143 mmol/L (ref 135–145)

## 2024-02-11 LAB — CBC
HCT: 46.5 % (ref 39.0–52.0)
Hemoglobin: 15.3 g/dL (ref 13.0–17.0)
MCH: 28.4 pg (ref 26.0–34.0)
MCHC: 32.9 g/dL (ref 30.0–36.0)
MCV: 86.3 fL (ref 80.0–100.0)
Platelets: 234 10*3/uL (ref 150–400)
RBC: 5.39 MIL/uL (ref 4.22–5.81)
RDW: 12.7 % (ref 11.5–15.5)
WBC: 5.6 10*3/uL (ref 4.0–10.5)
nRBC: 0 % (ref 0.0–0.2)

## 2024-02-11 LAB — TROPONIN I (HIGH SENSITIVITY)
Troponin I (High Sensitivity): 7 ng/L (ref <18)
Troponin I (High Sensitivity): 7 ng/L (ref <18)

## 2024-02-11 MED ORDER — SODIUM CHLORIDE 0.9 % IV BOLUS
1000.0000 mL | Freq: Once | INTRAVENOUS | Status: AC
  Administered 2024-02-11: 1000 mL via INTRAVENOUS

## 2024-02-11 NOTE — Discharge Instructions (Addendum)
 Your evaluation in the emergency department is overall reassuring.  I do suspect you are somewhat dehydrated, and you received IV fluids here.  With regard to your chest discomfort, we saw no concerning findings today, but I would recommend you follow-up with your primary care provider for reevaluation.  Return to the emergency department with any new or worsening symptoms.

## 2024-02-11 NOTE — ED Provider Notes (Signed)
 [facility] Provider Note    Event Date/Time   First MD Initiated Contact with Patient 02/11/24 2008     (approximate)   History   Chest Pain, Tingling, and Dizziness  Pt to ED via POV from home. Pt reports dizziness and tingling in her left fingers that started yesterday. Pt reports today started having centralized chest tightness. Pt with hx afib. Pt is on blood thinner.    HPI Earl Griffin is a 63 y.o. male PMH A-fib on Eliquis , T2DM, hypertension, hypertension, hyperlipidemia presents for evaluation of lightheadedness, chest tightness - Patient states in the past few days he has felt somewhat lightheaded, primarily notices this when standing and when bending over.  Since about 1:00 this afternoon has also noticed some vague substernal chest discomfort that felt like gas but has since resolved, believes he may have burped prior to resolution.  Has had some transient paresthesias to bilateral hands that did not appear to be associated with his prior chest discomfort.  - Takes all of his medications as prescribed - Notes recent outpatient labs that show evidence of dehydration       Physical Exam   Triage Vital Signs: ED Triage Vitals  Encounter Vitals Group     BP 02/11/24 1806 (!) 124/90     Systolic BP Percentile --      Diastolic BP Percentile --      Pulse Rate 02/11/24 1806 80     Resp 02/11/24 1806 20     Temp 02/11/24 1806 97.7 F (36.5 C)     Temp Source 02/11/24 1806 Oral     SpO2 02/11/24 1806 98 %     Weight --      Height --      Head Circumference --      Peak Flow --      Pain Score 02/11/24 1807 6     Pain Loc --      Pain Education --      Exclude from Growth Chart --     Most recent vital signs: Vitals:   02/11/24 2230 02/11/24 2233  BP: (!) 141/95   Pulse: 73 85  Resp: 14 17  Temp:    SpO2: 100% 99%     General: Awake, no distress.  CV:  Good peripheral perfusion. RRR, RP 2+ Resp:  Normal effort.  CTAB Abd:  No distention. Nontender to deep palpation throughout Neuro:  Aox4, CN II-XII intact, FNF wnl, finger taps fast b/l, 5/5 strength in bilateral finger extension/grip, arm flexion/extension, EHL/FHL. BUE AG 10+ sec no drift, BLE AG 5+ sec no drift. Ambulates with steady gait. SILT. Negative Rhomberg.    ED Results / Procedures / Treatments   Labs (all labs ordered are listed, but only abnormal results are displayed) Labs Reviewed  BASIC METABOLIC PANEL WITH GFR - Abnormal; Notable for the following components:      Result Value   Chloride 112 (*)    CO2 21 (*)    Glucose, Bld 217 (*)    Creatinine, Ser 1.41 (*)    GFR, Estimated 56 (*)    All other components within normal limits  CBC  TROPONIN I (HIGH SENSITIVITY)  TROPONIN I (HIGH SENSITIVITY)     EKG  See ED course below   RADIOLOGY Radiology interpreted by myself and radiology reports reviewed    PROCEDURES:  Critical Care performed: No  Procedures   MEDICATIONS ORDERED IN ED: Medications  sodium chloride  0.9 % bolus  1,000 mL (0 mLs Intravenous Stopped 02/11/24 2209)     IMPRESSION / MDM / ASSESSMENT AND PLAN / ED COURSE  I reviewed the triage vital signs and the nursing notes.                              DDX/MDM/AP: Differential diagnosis includes, but is not limited to, suspect dehydration/orthostasis, no clinical concern for underlying CVA at this time.  With regard to his mood concern of chest discomfort earlier, consider ACS, dyspepsia/gas, do not suspect acute intra-abdominal pathology.  Highly doubt pneumonia, pneumothorax, aortic dissection.  Plan: - Labs - EKG - Chest x-ray Reassess  Patient's presentation is most consistent with acute presentation with potential threat to life or bodily function.  The patient is on the cardiac monitor to evaluate for evidence of arrhythmia and/or significant heart rate changes.  ED course below.  Labs with AKI, treated with 1 L IV fluid.  Workup  otherwise unremarkable.  Serial troponins negative.  Full resolution of symptoms after IV fluid here.  With regard to his chest discomfort, patient is comfortable following up with outpatient provider for reevaluation as opposed to expedited stress testing.  Highly doubt cardiac etiology at this time given reassuring workup today.  ED return precautions in place. Patient agrees with plan.  Clinical Course as of 02/11/24 2338  Fri Feb 11, 2024  2030 BMP with mild bump in creatinine, otherwise unremarkable beyond mild hyperglycemia.   Cbc wnl  Trop wnl [MM]  2030 Chest x-ray reviewed, unremarkable my interpretation.  Radiology report reviewed. [MM]  2030 Ecg = sinus rhythm, rate 78, no gross ST elevation, trace ST depressions noted in inferior leads, left axis deviation, normal intervals.  Not markedly changed from multiple EKGs in 2022 for comparison. [MM]  2253 Rpt trop stable [MM]    Clinical Course User Index [MM] Collis Deaner, MD     FINAL CLINICAL IMPRESSION(S) / ED DIAGNOSES   Final diagnoses:  Lightheadedness  AKI (acute kidney injury) (HCC)  Atypical chest pain     Rx / DC Orders   ED Discharge Orders     None        Note:  This document was prepared using Dragon voice recognition software and may include unintentional dictation errors.   Collis Deaner, MD 02/11/24 [phone]

## 2024-02-11 NOTE — ED Triage Notes (Signed)
 Pt to ED via POV from home. Pt reports dizziness and tingling in her left fingers that started yesterday. Pt reports today started having centralized chest tightness. Pt with hx afib. Pt is on blood thinner.
